# Patient Record
Sex: Female | Born: 1955 | Race: Black or African American | Hispanic: No | Marital: Single | Smoking: Current every day smoker
Health system: Southern US, Community
[De-identification: ages and names within clinical notes are randomized; demographics above are authoritative.]

## PROBLEM LIST (undated history)

## (undated) DIAGNOSIS — T8859XA Other complications of anesthesia, initial encounter: Secondary | ICD-10-CM

## (undated) DIAGNOSIS — M4800 Spinal stenosis, site unspecified: Secondary | ICD-10-CM

## (undated) DIAGNOSIS — T4145XA Adverse effect of unspecified anesthetic, initial encounter: Secondary | ICD-10-CM

## (undated) DIAGNOSIS — J302 Other seasonal allergic rhinitis: Secondary | ICD-10-CM

## (undated) DIAGNOSIS — I10 Essential (primary) hypertension: Secondary | ICD-10-CM

## (undated) HISTORY — PX: CERVICAL FUSION: SHX112

## (undated) HISTORY — PX: BREAST BIOPSY: SHX20

## (undated) HISTORY — PX: BACK SURGERY: SHX140

## (undated) HISTORY — PX: OTHER SURGICAL HISTORY: SHX169

---

## 1998-10-03 ENCOUNTER — Ambulatory Visit (HOSPITAL_COMMUNITY): Admission: RE | Admit: 1998-10-03 | Discharge: 1998-10-03 | Payer: Self-pay | Admitting: Internal Medicine

## 1998-10-03 ENCOUNTER — Encounter: Payer: Self-pay | Admitting: Internal Medicine

## 1998-10-05 ENCOUNTER — Encounter: Payer: Self-pay | Admitting: Internal Medicine

## 1998-11-15 ENCOUNTER — Ambulatory Visit (HOSPITAL_BASED_OUTPATIENT_CLINIC_OR_DEPARTMENT_OTHER): Admission: RE | Admit: 1998-11-15 | Discharge: 1998-11-15 | Payer: Self-pay | Admitting: Orthopedic Surgery

## 1999-09-26 ENCOUNTER — Ambulatory Visit (HOSPITAL_COMMUNITY): Admission: RE | Admit: 1999-09-26 | Discharge: 1999-09-26 | Payer: Self-pay | Admitting: Internal Medicine

## 1999-09-26 ENCOUNTER — Encounter: Payer: Self-pay | Admitting: Internal Medicine

## 1999-11-30 ENCOUNTER — Ambulatory Visit (HOSPITAL_COMMUNITY): Admission: RE | Admit: 1999-11-30 | Discharge: 1999-11-30 | Payer: Self-pay | Admitting: Orthopaedic Surgery

## 1999-12-14 ENCOUNTER — Ambulatory Visit (HOSPITAL_COMMUNITY): Admission: RE | Admit: 1999-12-14 | Discharge: 1999-12-14 | Payer: Self-pay | Admitting: Orthopaedic Surgery

## 1999-12-28 ENCOUNTER — Ambulatory Visit (HOSPITAL_COMMUNITY): Admission: RE | Admit: 1999-12-28 | Discharge: 1999-12-28 | Payer: Self-pay | Admitting: Orthopaedic Surgery

## 2000-02-22 ENCOUNTER — Ambulatory Visit (HOSPITAL_COMMUNITY): Admission: RE | Admit: 2000-02-22 | Discharge: 2000-02-22 | Payer: Self-pay | Admitting: Neurosurgery

## 2000-02-22 ENCOUNTER — Encounter: Payer: Self-pay | Admitting: Neurosurgery

## 2000-03-11 ENCOUNTER — Ambulatory Visit (HOSPITAL_COMMUNITY): Admission: RE | Admit: 2000-03-11 | Discharge: 2000-03-12 | Payer: Self-pay | Admitting: Neurosurgery

## 2000-03-11 ENCOUNTER — Encounter: Payer: Self-pay | Admitting: Neurosurgery

## 2000-04-05 ENCOUNTER — Ambulatory Visit (HOSPITAL_COMMUNITY): Admission: RE | Admit: 2000-04-05 | Discharge: 2000-04-05 | Payer: Self-pay | Admitting: Neurosurgery

## 2000-04-05 ENCOUNTER — Encounter: Payer: Self-pay | Admitting: Neurosurgery

## 2000-04-23 ENCOUNTER — Encounter: Payer: Self-pay | Admitting: Neurosurgery

## 2000-04-23 ENCOUNTER — Ambulatory Visit (HOSPITAL_COMMUNITY): Admission: RE | Admit: 2000-04-23 | Discharge: 2000-04-23 | Payer: Self-pay | Admitting: Neurosurgery

## 2000-05-08 ENCOUNTER — Ambulatory Visit (HOSPITAL_COMMUNITY): Admission: RE | Admit: 2000-05-08 | Discharge: 2000-05-08 | Payer: Self-pay | Admitting: Neurosurgery

## 2000-05-08 ENCOUNTER — Encounter: Payer: Self-pay | Admitting: Neurosurgery

## 2000-12-03 ENCOUNTER — Ambulatory Visit (HOSPITAL_BASED_OUTPATIENT_CLINIC_OR_DEPARTMENT_OTHER): Admission: RE | Admit: 2000-12-03 | Discharge: 2000-12-03 | Payer: Self-pay | Admitting: Pulmonary Disease

## 2001-04-28 ENCOUNTER — Encounter: Payer: Self-pay | Admitting: Neurosurgery

## 2001-04-28 ENCOUNTER — Encounter: Admission: RE | Admit: 2001-04-28 | Discharge: 2001-04-28 | Payer: Self-pay | Admitting: Neurosurgery

## 2003-11-18 ENCOUNTER — Emergency Department (HOSPITAL_COMMUNITY): Admission: EM | Admit: 2003-11-18 | Discharge: 2003-11-18 | Payer: Self-pay | Admitting: Emergency Medicine

## 2004-05-22 ENCOUNTER — Emergency Department (HOSPITAL_COMMUNITY): Admission: EM | Admit: 2004-05-22 | Discharge: 2004-05-22 | Payer: Self-pay | Admitting: Emergency Medicine

## 2004-06-08 ENCOUNTER — Other Ambulatory Visit (HOSPITAL_COMMUNITY): Admission: RE | Admit: 2004-06-08 | Discharge: 2004-09-06 | Payer: Self-pay | Admitting: Psychiatry

## 2004-06-29 ENCOUNTER — Ambulatory Visit (HOSPITAL_COMMUNITY): Payer: Self-pay | Admitting: Professional Counselor

## 2005-01-09 ENCOUNTER — Emergency Department (HOSPITAL_COMMUNITY): Admission: EM | Admit: 2005-01-09 | Discharge: 2005-01-09 | Payer: Self-pay | Admitting: Emergency Medicine

## 2005-01-12 ENCOUNTER — Ambulatory Visit: Payer: Self-pay | Admitting: Internal Medicine

## 2005-02-14 ENCOUNTER — Ambulatory Visit: Payer: Self-pay | Admitting: Internal Medicine

## 2005-05-21 ENCOUNTER — Ambulatory Visit: Payer: Self-pay | Admitting: Internal Medicine

## 2005-11-29 ENCOUNTER — Encounter (INDEPENDENT_AMBULATORY_CARE_PROVIDER_SITE_OTHER): Payer: Self-pay | Admitting: Family Medicine

## 2005-12-15 ENCOUNTER — Ambulatory Visit: Payer: Self-pay | Admitting: Family Medicine

## 2006-06-13 ENCOUNTER — Emergency Department (HOSPITAL_COMMUNITY): Admission: EM | Admit: 2006-06-13 | Discharge: 2006-06-13 | Payer: Self-pay | Admitting: Emergency Medicine

## 2006-07-09 ENCOUNTER — Ambulatory Visit: Payer: Self-pay | Admitting: Internal Medicine

## 2006-07-10 ENCOUNTER — Ambulatory Visit: Payer: Self-pay | Admitting: *Deleted

## 2006-11-05 ENCOUNTER — Ambulatory Visit: Payer: Self-pay | Admitting: Internal Medicine

## 2006-11-11 ENCOUNTER — Ambulatory Visit: Payer: Self-pay | Admitting: Internal Medicine

## 2007-02-05 ENCOUNTER — Ambulatory Visit: Payer: Self-pay | Admitting: Family Medicine

## 2007-03-08 ENCOUNTER — Emergency Department (HOSPITAL_COMMUNITY): Admission: EM | Admit: 2007-03-08 | Discharge: 2007-03-08 | Payer: Self-pay | Admitting: Emergency Medicine

## 2007-03-10 ENCOUNTER — Ambulatory Visit: Payer: Self-pay | Admitting: Internal Medicine

## 2007-07-16 ENCOUNTER — Encounter (INDEPENDENT_AMBULATORY_CARE_PROVIDER_SITE_OTHER): Payer: Self-pay | Admitting: *Deleted

## 2007-07-28 ENCOUNTER — Encounter (INDEPENDENT_AMBULATORY_CARE_PROVIDER_SITE_OTHER): Payer: Self-pay | Admitting: Family Medicine

## 2007-07-28 DIAGNOSIS — E785 Hyperlipidemia, unspecified: Secondary | ICD-10-CM

## 2007-07-28 DIAGNOSIS — I10 Essential (primary) hypertension: Secondary | ICD-10-CM | POA: Insufficient documentation

## 2007-08-18 DIAGNOSIS — F17200 Nicotine dependence, unspecified, uncomplicated: Secondary | ICD-10-CM | POA: Insufficient documentation

## 2008-04-04 ENCOUNTER — Inpatient Hospital Stay (HOSPITAL_COMMUNITY): Admission: EM | Admit: 2008-04-04 | Discharge: 2008-04-05 | Payer: Self-pay | Admitting: Emergency Medicine

## 2008-04-08 ENCOUNTER — Encounter (INDEPENDENT_AMBULATORY_CARE_PROVIDER_SITE_OTHER): Payer: Self-pay | Admitting: Internal Medicine

## 2008-04-08 ENCOUNTER — Observation Stay (HOSPITAL_COMMUNITY): Admission: EM | Admit: 2008-04-08 | Discharge: 2008-04-09 | Payer: Self-pay | Admitting: Emergency Medicine

## 2008-10-04 ENCOUNTER — Emergency Department (HOSPITAL_COMMUNITY): Admission: EM | Admit: 2008-10-04 | Discharge: 2008-10-04 | Payer: Self-pay | Admitting: Emergency Medicine

## 2009-01-20 ENCOUNTER — Encounter: Admission: RE | Admit: 2009-01-20 | Discharge: 2009-01-20 | Payer: Self-pay | Admitting: Emergency Medicine

## 2009-05-05 IMAGING — CT CT HEAD W/O CM
1 of 2 series · 13 of 30 positions shown, 17 images · non-contrast
Comparison: None

CLINICAL DATA: Dizziness.  Left arm parasthesias.

CT HEAD WITHOUT CONTRAST
TECHNIQUE: Contiguous axial images were obtained from the base of
the skull through the vertex without contrast

[Series 2: brain · axial · 0.47mm/px · z∈[+105,+223]mm · 13 of 32 slices shown, 17 images]
[im 3/32  brain]
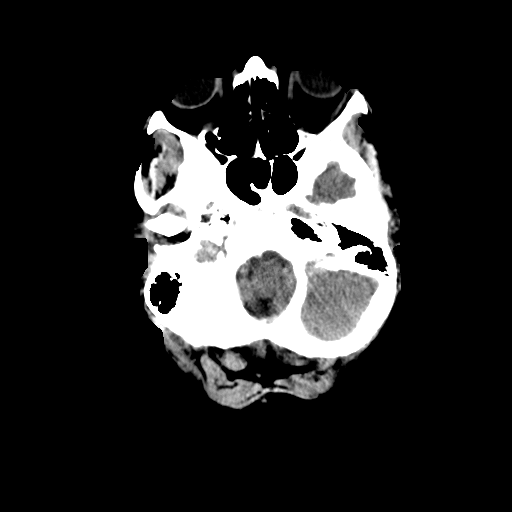
[im 3/32  bone]
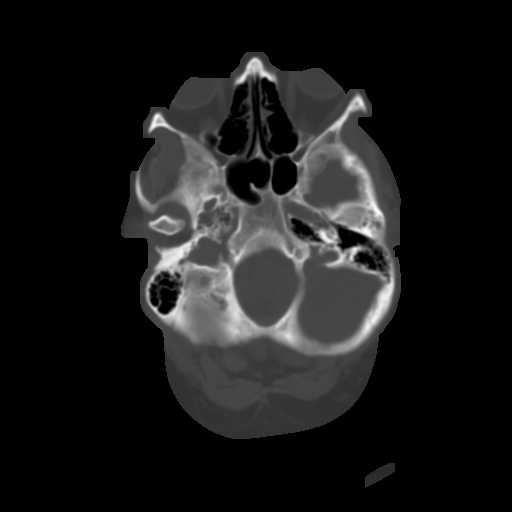
[im 5/32  brain]
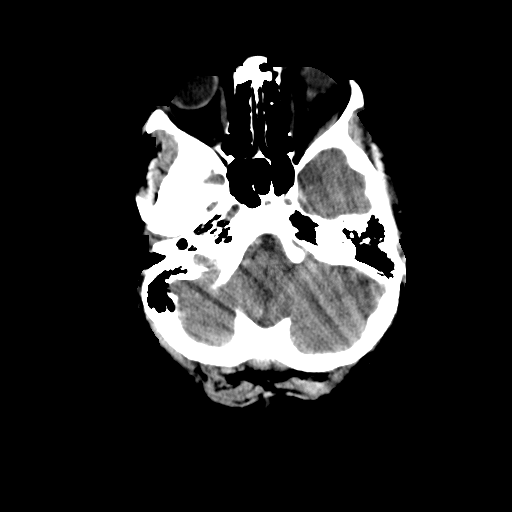
[im 7/32  brain]
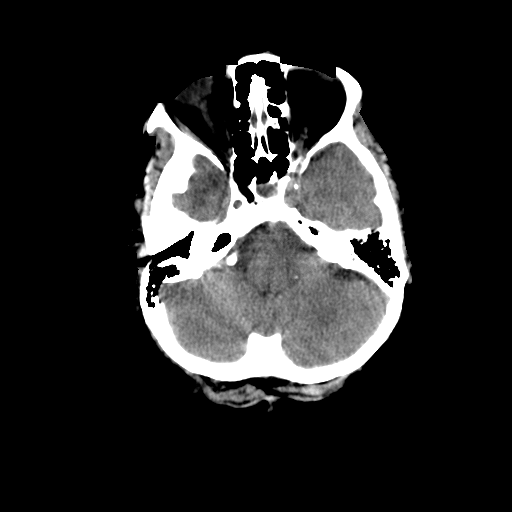
[im 9/32  brain]
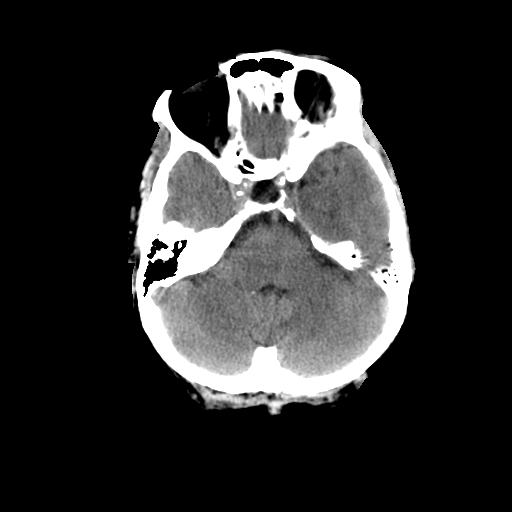
[im 12/32  brain]
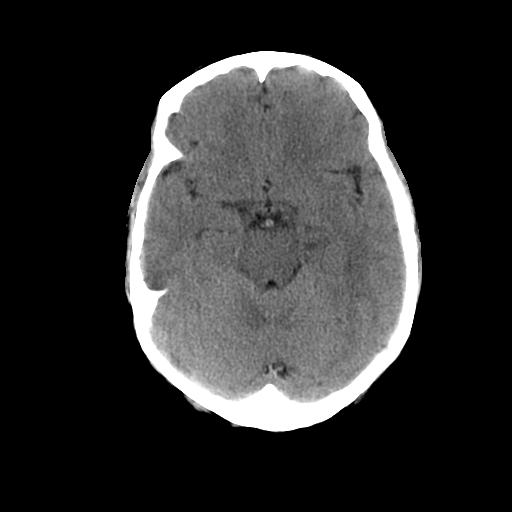
[im 12/32  bone]
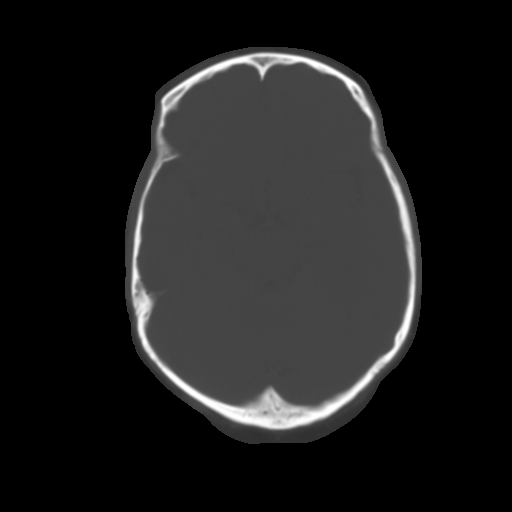
[im 14/32  brain]
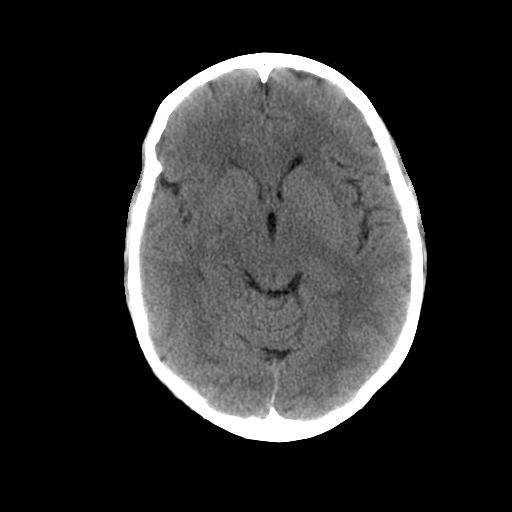
[im 16/32  brain]
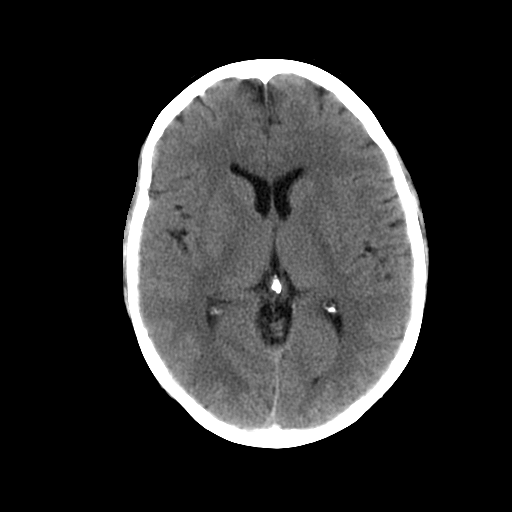
[im 18/32  brain]
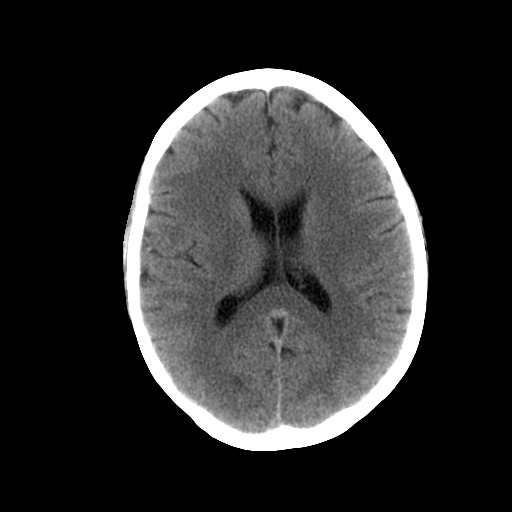
[im 20/32  brain]
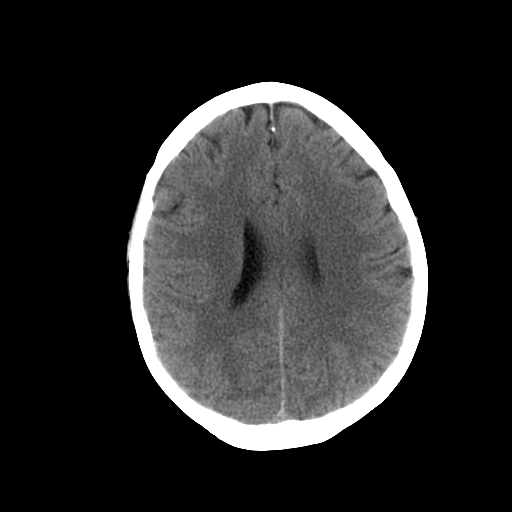
[im 20/32  bone]
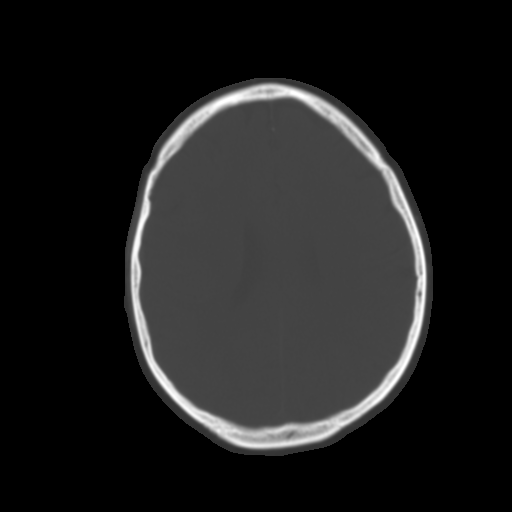
[im 23/32  brain]
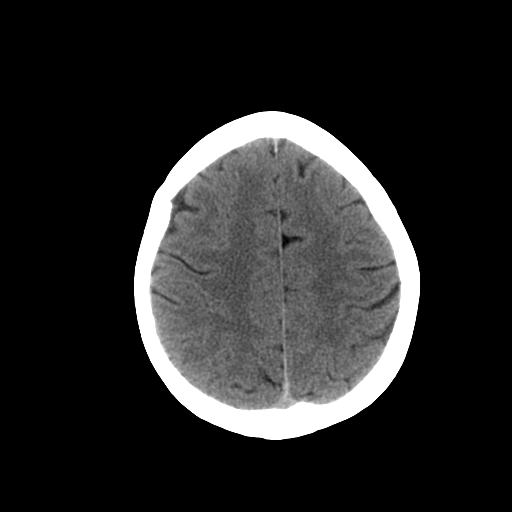
[im 25/32  brain]
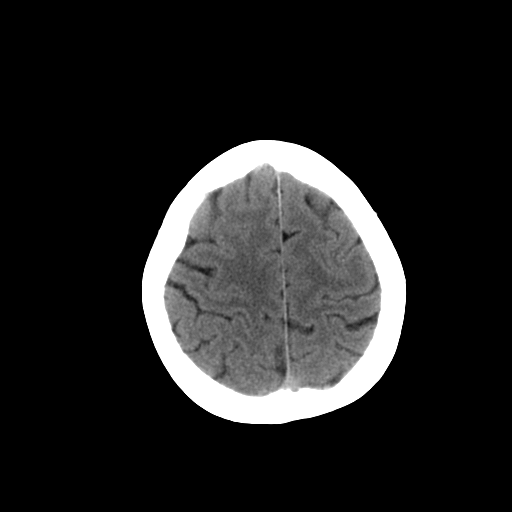
[im 27/32  brain]
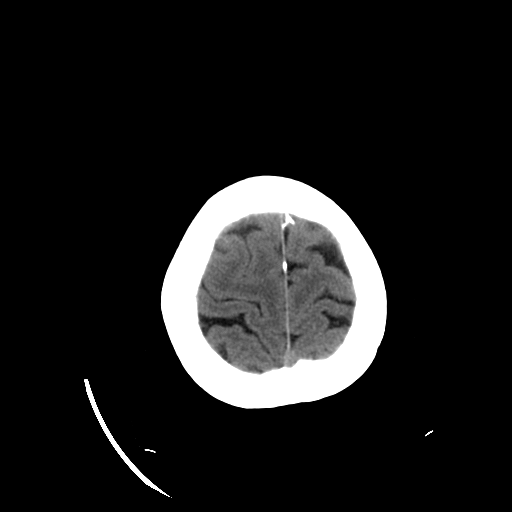
[im 29/32  brain]
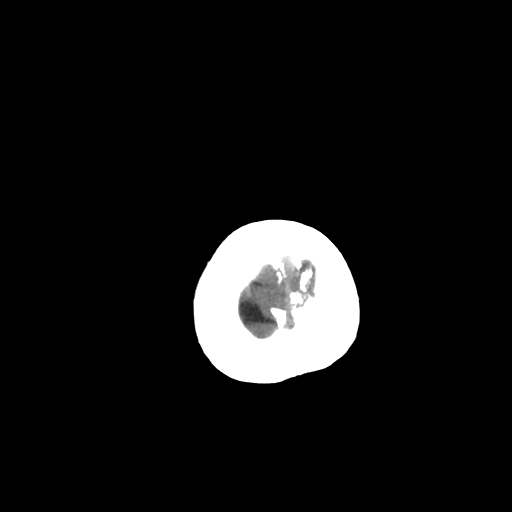
[im 29/32  bone]
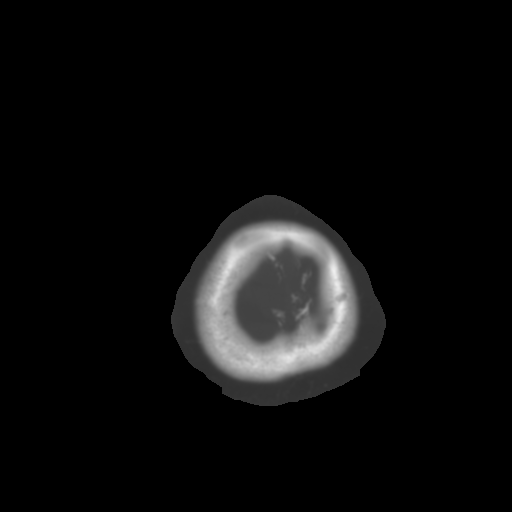

[13 of 30 positions shown; findings below may reference images not displayed]

FINDINGS: There is no evidence of intracranial hemorrhage, brain
edema, or other signs of acute infarction.  There is no evidence of
intracranial mass lesions, or mass effect.  No abnormal extraaxial
fluid collections are identified.  There is no evidence of
hydrocephalus, or other significant intracranial abnormality.  No
skull abnormality identified.
IMPRESSION: Negative non-contrast head CT.

## 2009-10-29 HISTORY — PX: COLONOSCOPY: SHX174

## 2010-11-19 ENCOUNTER — Encounter: Payer: Self-pay | Admitting: Emergency Medicine

## 2011-03-13 NOTE — H&P (Signed)
NAME:  Tanya Patton, Tanya Patton NO.:  [id number]   MEDICAL RECORD NO.:  [id number]          PATIENT TYPE:  EMS   LOCATION:  MAJO                         FACILITY:  [facility]   PHYSICIAN:  Michelene Gardener, MD    DATE OF BIRTH:  1956-06-01   DATE OF ADMISSION:  04/08/2008  DATE OF DISCHARGE:                              HISTORY & PHYSICAL   PRIMARY CARE PHYSICIAN:  Unassigned.   HISTORY OF PRESENT ILLNESS:  This is 55 year old African-American female  with past medical history of hypertension, obesity, and hyperlipidemia  presented with chest pain.  This patient was admitted to the hospital in  the period between June 7 to April 05, 2008.  She was observed overnight,  ruled out for MI, and she was sent home.  Her medications were switched  at that time from hydrochlorothiazide to lisinopril because of  hypokalemia.  The patient followed with her primary doctor last Tuesday.  She has been doing fine.  Yesterday she was complaining of nausea and  dry heaves.  Today she was complaining of chest pain described as  heaviness in the middle of her chest with some radiation to her left  shoulder and associated with some numbness in her left fingers.  She was  also having some nausea, some dry heaves.  She was also complaining of  numbness around her mouth.  Denied sweating.  She was positive for  shortness of breath.  There are no palpitations, and there is no  syncope.  She came to the ER.  CT scan of the head was done, and it  seems to be normal.   PAST MEDICAL HISTORY:  Significant for:   1. Hypertension.  2. Obesity.  3. Borderline hyperlipidemia.   PAST SURGICAL HISTORY:  1. Back surgery.  2. Elbow and wrist surgery.   CURRENT MEDICATIONS:  Lisinopril 10 mg once a day.   ALLERGIES:  No known drug allergies.   SOCIAL HISTORY:  She lives with her sister.  She has one daughter.  She  smoked one pack per day for more than 33 years.  She used to drink  occasionally for 30  years.  No history of drug use.   FAMILY HISTORY:  Her grandfather died of heart attack in his 14's.   REVIEW OF SYSTEMS:  CONSTITUTIONAL:  There is no fatigability.  No  fever.  EYES:  No blurred vision.  ENT:  No tinnitus, no epistaxis, no  difficulty swallowing.  RESPIRATORY:  No cough, no wheezes, no  hemoptysis.  CARDIOVASCULAR:  Positive for chest pain and shortness of  breath.  There is no syncope.  There is no palpitations.  GU:  No  dysuria, no hematuria.  ENDOCRINE:  No polyuria, no nocturia.  HEMATOLOGY:  No bruises, no bleeding.  ID:  No rashes, no lesions.  NEUROLOGICAL:  Positive for numbness in her left upper extremity.  SKIN:  No rash, no lesions.  The rest of Review of Systems were reviewed, and  they were negative.   PHYSICAL EXAMINATION:  VITAL SIGNS:  Temperature is 150/91, pulse 89,  respiratory rate 20, temperature  97.6.  GENERAL APPEARANCE:  This is an obese middle-aged African-American  female not in acute distress.  HEENT:  Her conjunctivae are pink.  Her pupils are equal and reactive to  light.  There is no ptosis.  Hearing is intact.  There is no ear  discharge or infection.  There is no nose infection or bleeding.  Oral  mucosa is dry.  No pharyngeal erythema.  NECK:  Supple.  No JVD, no carotid bruit, no thyroid enlargement.  CARDIOVASCULAR:  S1, S2 are regular.  There are no murmurs.  RESPIRATORY:  The patient is breathing between 16-18.  There are no  rales, no rhonchi, no wheezes.  ABDOMEN:  Soft, nondistended.  No hepatosplenomegaly.  Bowel sounds are  normal.  EXTREMITIES:  Lower extremities:  No edema, no rash, and no varicose  veins.  SKIN:  No rash, no erythema.  NEUROLOGICAL:  Cranial nerves are intact.  There are no motor or sensory  deficits.   LABORATORY RESULTS:  WBC 5.7, hemoglobin 14.1, hematocrit 40.8, platelet  count 243, MCV 92.3.  CK-MB is less than 1, troponin less than 0.05, INR  0.9.  Urinalysis negative.  Sodium 141, potassium  4.4, chloride 110,  bicarb 22, glucose 100, BUN 10, creatinine 0.62.   Chest x-ray showed no acute abnormalities.  CT scan of the head  negative.   IMPRESSION:  1. Chest pain.  2. Hypertension.  3. Obesity  4. Hyperlipidemia.  5. Tobacco abuse.  6. Anxiety.   PLAN:  This patient is a 55 year old female who had risk factors for  coronary artery disease that includes hypertension, obesity, smoking,  and borderline hyperlipidemia.  The patient was admitted a few days ago  for evaluation of chest pain.  She was ruled out and was sent home.  Came in again with another episode of chest pain.  I will admit her to  telemetry.  Will get three sets of troponin and cardiac enzymes.  Will  get echocardiogram to assess her wall motion.  Since her second  admission in a few days I will get cardiac evaluation for possible  stress test.  I will put her on aspirin.  Will continue her lisinopril.  Will add metoprolol.  I will also put her on Lovenox subcu.   Assessment time is one hour.      Michelene Gardener, MD  Electronically Signed     NAE/MEDQ  D:  04/08/2008  T:  04/08/2008  Job:  [id number]

## 2011-03-13 NOTE — H&P (Signed)
NAME:  Tanya Patton, Tanya Patton NO.:  [id number]   MEDICAL RECORD NO.:  [id number]          PATIENT TYPE:  EMS   LOCATION:  MAJO                         FACILITY:  [facility]   PHYSICIAN:  Herbie Saxon, MDDATE OF BIRTH:  25-Aug-1956   DATE OF ADMISSION:  04/04/2008  DATE OF DISCHARGE:                              HISTORY & PHYSICAL   PRIMARY CARE PHYSICIAN:  Unassigned.   The patient is full code.  She has no assigned health care power of  attorney.   PRESENTING COMPLAINT:  Chest pain, mild shortness of breath, 1 hour.   HISTORY OF PRESENTING COMPLAINT:  This is a 55 year old African-American  female with past medical history of hypertension who has got well until  1 hour prior to presentation when she started experiencing sharp  retrosternal chest pain radiating to her left arm.  Chest pain was  10/10, increased with exertional movement, relieved by lying still,  associated with dry cough.  No hemoptysis.  No palpitations.  No  diaphoresis.  No loss of consciousness.  She denies any pedal swelling  or facial swelling.  She had mild anxiety and shortness of breath when  the chest pain started.  The chest pain has been relieved after she was  started on nitroglycerine and aspirin, this is 4/10 intensity at  present.  She denies previous episodes of chest pain.  She does have a  family history of heart attack on maternal grandfather.  She has more  than 33-year history of tobacco smoking.  The patient denies any  symptoms referable to the genitourinary, gastrointestinal, respiratory,  neurological systems.  No joint swelling.  No skin rash.  No fever.   PAST MEDICAL HISTORY:  Hypertension.   PAST SURGICAL HISTORY:  1. Back surgery.  2. Elbow and wrist surgery.   SOCIAL HISTORY:  She lives with her sister.  She has 1 daughter.  She  smokes 1 pack a day for more than 33 years.  She used to drink socially  for about 30 years.  No history of drug abuse.   REVIEW OF  SYSTEMS:  Fourteen systems are reviewed, pertinent positives  as in the history of presenting complaints.   MEDICATIONS:  HCTZ 1 tablet daily.   ALLERGIES:  No known drug allergies.   PHYSICAL EXAMINATION:  GENERAL:  On examination, she is a middle-aged  lady, obese, not in acute respiratory distress.  VITAL SIGNS:  Temperature is 97.4, pulse is 80, respiratory rate is 16,  and blood pressure 104/64.  HEENT:  Pupils are equal, reactive to light and accommodation.  She has  no pallor or jaundice.  There is no cyanosis or finger clubbing.  Extraocular muscles are intact.  Head is atraumatic and normocephalic.  Mucous membranes are moist.  NECK:  Supple.  No elevated JVD or thyromegaly.  CHEST:  Clinically clear.  HEART:  Heart sounds 1 and 2, regular rate and rhythm.  ABDOMEN:  She has truncal obesity, soft, and nontender.  No  organomegaly.  NEUROLOGIC:  She is alert and oriented in time, place, and person.  Power is 5 globally.  Deep  tendon reflexes 2 globally.  Cranial nerves  II-XII are intact.  Sensory system normal.  Peripheral pulses present.  No pedal edema.   LABORATORY DATA:  WBC of 5.8, hematocrit 39.8, platelet count 239.  Troponin I less than 0.04.  Chemistry shows a sodium of 129, potassium  3.4, chloride 102, bicarbonate 28, glucose 108, BUN 18, creatinine 0.6,  INR of 0.9, PT 11.8, PTT 34.  Chest x-ray shows mild basilar  atelectasis.  No significant infiltrate or effusion.  EKG, normal sinus  rhythm at 86 per minute.   ASSESSMENT:  1. Atypical chest pain, unclear acute coronary syndrome.  2. Hypokalemia on diuretic.  3. Tobacco abuse.  4. Anxiety.  5. Morbid obesity.  6. Hyperglycemia.   The patient is to be admitted to an observation telemetry bed, get a  serial cardiac enzymes and EEG q.8 h. x3.  Obtain a 2D echocardiogram in  the morning, obtain a D-dimer, hemoccult, thyroid function test, fasting  lipids, hemoglobin A1c of 16 level, and urinalysis.  She  was given  morphine 2 mg IV q.6 h. p.r.n. for chest pain, oxygen 2-4 liters nasal  cannula to keep saturations greater than 90, Nitro paste q.6 h.  Hold  nitro paste if blood pressure is less than 100/60.  Started on Toprol-XL  12.5 mg daily.  Continue HCTZ 12.5 mg daily supplement, potassium 20 mEq  IV and potassium 20 mEq p.o. daily.  Check CBC and CMP in the morning,  counsel on tobacco cessation.  Started on nicotine patch 21 mg daily,  Xanax 0.5 mg p.o. b.i.d., Protonix 40 mg IV daily, Lovenox 40 mg subcu  daily, Phenergan 25 mg IV q.8 h. p.r.n. for nausea.   DIET:  Heart healthy, IV fluid normal saline at 30 mL an hour.  Diet  also will be low cholesterol.  The patient's illness, medications,  treatments plan were explained to her.  She verbalizes understanding.      Herbie Saxon, MD  Electronically Signed     MIO/MEDQ  D:  04/04/2008  T:  04/05/2008  Job:  [id number]

## 2011-03-16 NOTE — Op Note (Signed)
[facility]  Patient:    Tanya Patton, Tanya Patton                    MRN: [id number] Proc. Date: 03/11/00 Adm. Date:  [id number] Attending:  Gerald Dexter                           Operative Report  PREOPERATIVE DIAGNOSIS:  Herniated disc, L4-5, left.  POSTOPERATIVE DIAGNOSIS:  Herniated disc, L4-5, left.  PROCEDURE: 1. Left L4-5 intralaminal laminotomy for excision of herniated disc with    operative microscope. 2. Microdissection L4-5 disc and all five nerve roots.  SURGEON:  Reinaldo Meeker, M.D.  ASSISTANT:  Julio Sicks, M.D.  PROCEDURE IN DETAIL:  After being placed in the prone position, the patients back was prepped and draped in the usual sterile fashion.  Localizing x-ray was taken to prior to incision to identify the L4-5 level.  Midline incision made above the spinous processes of L4 and L5.  Using the Bovie cutting current, the incision was carried down to the spinous processes. Subperiosteal dissection was then carried out along the left side of the spinous processes and the lamina and the McCall self-retaining retractor was placed for exposure.  Second x-ray was taken to confirm approach to the L4-5 and this was correct.  Using the high speed drill the inferior one-third of the L4 lamina and the medial one-third of the facet joint were removed.  It was then used to remove the superior one-third of the L5 lamina.  Residual bone and ligamentum flava were removed in a piecemeal fashion.  The microscope was draped and brought into the field and used for the remainder of the case. Using microdissection technique, the lateral aspect of the thecal sac and all five nerve roots were identified.  Further coagulation was carried out down the canal to identify the L4-5 disc which was found to be herniated toward the foramen.  After coagulating on the annulus, the annulus was excised with a 15 blade.  Using pituitary rongeurs and curets, the  disc space was thoroughly cleaned out including particular attention toward the foramen where a large number of fragments of disc material and subligamentous tissue were removed. This gave excellent decompression within the foramen.  At this point inspection was carried out in all direction for any evidence of residual compression and none could be identified.  Large amounts of irrigation were carried and any bleeding controlled with coagulation and Gelfoam.  The wound was then closed using interrupted Vicryl in the muscle, fascia, subcutaneous and subcuticular tissue and staples on the skin.  Sterile was then applied. The patient was extubated and taken to the recovery room in stable condition. D:  03/11/00 TD:  03/12/00 Job: [id number]

## 2011-03-16 NOTE — Discharge Summary (Signed)
NAME:  CAROLYNNE, SCHUCHARD             ACCOUNT NO.:  [id number]   MEDICAL RECORD NO.:  [id number]          PATIENT TYPE:  INP   LOCATION:  4740                         FACILITY:  [facility]   PHYSICIAN:  Michelene Gardener, MD    DATE OF BIRTH:  [birth year]   DATE OF ADMISSION:  04/08/2008  DATE OF DISCHARGE:  04/09/2008                               DISCHARGE SUMMARY   PRIMARY PHYSICIAN:  Unassigned   DISCHARGE DIAGNOSES:  1. Chest pain.  2. Hypertension.  3. Obesity.  4. Hyperlipidemia.  5. Tobacco abuse.  6. Anxiety.   DISCHARGE MEDICATIONS:  1. Lisinopril 10 mg once a day.  2. Aspirin 325 mg p.o. once a day.   CONSULTATIONS:  Cardiology consult.   PROCEDURES:  None.   RADIOLOGY STUDIES:  1. Chest x-ray.  On April 08, 2008, showed no acute abnormalities.  2. A CT scan of the head without contrast.  On April 08, 2008, showed      no acute abnormalities.   COURSE OF HOSPITALIZATION:  This is a 55 year old female with past medical  history of hypertension who presented to the hospital complaining of  chest pain.  The patient was admitted to telemetry.  No arrhythmias were  seen in his telemetry.  Three sets of troponin and cardiac enzymes were  done and they came to be normal.  His EKG showed no evidence of acute  ischemia.  Cardiology evaluation was done, and the patient was  recommended for outpatient stress test.  Stress test was scheduled at  12:00 p.m. on Tuesday following his discharge.  At the time of  discharge, the patient was stable.  Chest pain resolved.  Vitals were  stable.   TIME SPENT:  40 minutes.      Michelene Gardener, MD  Electronically Signed     NAE/MEDQ  D:  05/08/2008  T:  05/09/2008  Job:  [id number]

## 2011-07-26 LAB — CBC
HCT: 40.8
Hemoglobin: 14.1
MCHC: 34.4
MCHC: 34.5
MCV: 92.3
Platelets: 243
RBC: 4.3
RBC: 4.42
RDW: 13.8
RDW: 13.9
WBC: 5.7

## 2011-07-26 LAB — URINALYSIS, ROUTINE W REFLEX MICROSCOPIC
Bilirubin Urine: NEGATIVE
Glucose, UA: NEGATIVE
Ketones, ur: NEGATIVE
Ketones, ur: NEGATIVE
Leukocytes, UA: NEGATIVE
Nitrite: NEGATIVE
Protein, ur: NEGATIVE
Protein, ur: NEGATIVE
Specific Gravity, Urine: 1.022
Urobilinogen, UA: 1
Urobilinogen, UA: 1
pH: 6

## 2011-07-26 LAB — HOMOCYSTEINE: Homocysteine: 14.3

## 2011-07-26 LAB — POCT CARDIAC MARKERS
CKMB, poc: <1 — ABNORMAL LOW
CKMB, poc: <1 — ABNORMAL LOW
Myoglobin, poc: 40.7
Myoglobin, poc: 48.5
Operator id: 294501
Troponin i, poc: <0.05

## 2011-07-26 LAB — COMPREHENSIVE METABOLIC PANEL WITH GFR
ALT: 30
AST: 22
Albumin: 3.4 — ABNORMAL LOW
Alkaline Phosphatase: 57
BUN: 10
CO2: 22
Calcium: 8.9
Chloride: 110
Creatinine, Ser: 0.62
GFR calc non Af Amer: >60
Glucose, Bld: 100 — ABNORMAL HIGH
Potassium: 4.1
Sodium: 141
Total Bilirubin: 0.9
Total Protein: 6.4

## 2011-07-26 LAB — CARDIAC PANEL(CRET KIN+CKTOT+MB+TROPI)
CK, MB: 0.7
CK, MB: 0.7
CK, MB: 0.7
Relative Index: INVALID
Relative Index: INVALID
Relative Index: INVALID
Total CK: 102
Total CK: 71
Total CK: 79

## 2011-07-26 LAB — BASIC METABOLIC PANEL WITH GFR
BUN: 18
CO2: 28
Calcium: 9.4
Chloride: 102
Creatinine, Ser: 0.68
GFR calc non Af Amer: >60
Glucose, Bld: 108 — ABNORMAL HIGH
Potassium: 3.4 — ABNORMAL LOW
Sodium: 139

## 2011-07-26 LAB — D-DIMER, QUANTITATIVE: D-Dimer, Quant: <0.22

## 2011-07-26 LAB — COMPREHENSIVE METABOLIC PANEL
CO2: 27
Calcium: 9.1
Creatinine, Ser: 0.65
GFR calc non Af Amer: >60
Glucose, Bld: 89

## 2011-07-26 LAB — URINE MICROSCOPIC-ADD ON

## 2011-07-26 LAB — HEMOGLOBIN A1C
Hgb A1c MFr Bld: 5.8
Mean Plasma Glucose: 129

## 2011-07-26 LAB — CK TOTAL AND CKMB (NOT AT ARMC)
CK, MB: 0.9
Relative Index: 0.8

## 2011-07-26 LAB — DIFFERENTIAL
Basophils Absolute: 0
Lymphocytes Relative: 48 — ABNORMAL HIGH
Lymphs Abs: 2.8
Neutro Abs: 2.4
Neutrophils Relative %: 41 — ABNORMAL LOW

## 2011-07-26 LAB — PROTIME-INR
INR: 0.9
INR: 0.9
Prothrombin Time: 11.8
Prothrombin Time: 12.6

## 2011-07-26 LAB — LIPID PANEL: VLDL: 36

## 2011-07-26 LAB — TROPONIN I: Troponin I: <0.01

## 2011-07-26 LAB — APTT: aPTT: 36

## 2011-08-03 LAB — DIFFERENTIAL
Eosinophils Absolute: 0.1 10*3/uL (ref 0.0–0.7)
Eosinophils Relative: 1 % (ref 0–5)
Lymphocytes Relative: 37 % (ref 12–46)
Lymphs Abs: 3.2 10*3/uL (ref 0.7–4.0)
Monocytes Absolute: 0.3 10*3/uL (ref 0.1–1.0)

## 2011-08-03 LAB — RAPID URINE DRUG SCREEN, HOSP PERFORMED
Amphetamines: NOT DETECTED
Barbiturates: NOT DETECTED
Cocaine: NOT DETECTED
Opiates: NOT DETECTED
Tetrahydrocannabinol: NOT DETECTED

## 2011-08-03 LAB — POCT I-STAT, CHEM 8
BUN: 16 mg/dL (ref 6–23)
Calcium, Ion: 1.09 mmol/L — ABNORMAL LOW (ref 1.12–1.32)
Creatinine, Ser: 0.9 mg/dL (ref 0.4–1.2)
Glucose, Bld: 119 mg/dL — ABNORMAL HIGH (ref 70–99)
Hemoglobin: 16.3 g/dL — ABNORMAL HIGH (ref 12.0–15.0)
TCO2: 21 mmol/L (ref 0–100)

## 2011-08-03 LAB — CBC
HCT: 46.6 % — ABNORMAL HIGH (ref 36.0–46.0)
Hemoglobin: 15.8 g/dL — ABNORMAL HIGH (ref 12.0–15.0)
MCV: 93 fL (ref 78.0–100.0)
WBC: 8.7 10*3/uL (ref 4.0–10.5)

## 2011-08-03 LAB — ETHANOL: Alcohol, Ethyl (B): 144 mg/dL — ABNORMAL HIGH (ref 0–10)

## 2012-08-18 ENCOUNTER — Other Ambulatory Visit: Payer: Self-pay | Admitting: *Deleted

## 2012-08-18 DIAGNOSIS — Z1231 Encounter for screening mammogram for malignant neoplasm of breast: Secondary | ICD-10-CM

## 2012-09-18 ENCOUNTER — Ambulatory Visit
Admission: RE | Admit: 2012-09-18 | Discharge: 2012-09-18 | Disposition: A | Discharge status: Home / Self Care | Payer: BC Managed Care – PPO | Source: Ambulatory Visit | Attending: *Deleted | Admitting: *Deleted

## 2012-09-18 DIAGNOSIS — Z1231 Encounter for screening mammogram for malignant neoplasm of breast: Secondary | ICD-10-CM

## 2013-03-10 ENCOUNTER — Other Ambulatory Visit: Payer: Self-pay | Admitting: Neurosurgery

## 2013-03-10 DIAGNOSIS — M542 Cervicalgia: Secondary | ICD-10-CM

## 2013-03-10 DIAGNOSIS — M5412 Radiculopathy, cervical region: Secondary | ICD-10-CM

## 2013-03-12 ENCOUNTER — Ambulatory Visit
Admission: RE | Admit: 2013-03-12 | Discharge: 2013-03-12 | Disposition: A | Discharge status: Home / Self Care | Payer: BC Managed Care – PPO | Source: Ambulatory Visit | Attending: Neurosurgery | Admitting: Neurosurgery

## 2013-03-12 DIAGNOSIS — M542 Cervicalgia: Secondary | ICD-10-CM

## 2013-03-12 DIAGNOSIS — M5412 Radiculopathy, cervical region: Secondary | ICD-10-CM

## 2013-05-07 ENCOUNTER — Other Ambulatory Visit: Payer: Self-pay | Admitting: Neurosurgery

## 2013-05-07 DIAGNOSIS — M549 Dorsalgia, unspecified: Secondary | ICD-10-CM

## 2013-05-10 ENCOUNTER — Ambulatory Visit
Admission: RE | Admit: 2013-05-10 | Discharge: 2013-05-10 | Disposition: A | Discharge status: Home / Self Care | Payer: BC Managed Care – PPO | Source: Ambulatory Visit | Attending: Neurosurgery | Admitting: Neurosurgery

## 2013-05-10 DIAGNOSIS — M549 Dorsalgia, unspecified: Secondary | ICD-10-CM

## 2013-08-12 ENCOUNTER — Other Ambulatory Visit: Payer: Self-pay | Admitting: Neurosurgery

## 2013-08-18 ENCOUNTER — Other Ambulatory Visit: Payer: Self-pay

## 2013-08-18 DIAGNOSIS — Z1231 Encounter for screening mammogram for malignant neoplasm of breast: Secondary | ICD-10-CM

## 2013-09-08 ENCOUNTER — Encounter (HOSPITAL_COMMUNITY): Payer: Self-pay | Admitting: Emergency Medicine

## 2013-09-08 ENCOUNTER — Emergency Department (HOSPITAL_COMMUNITY)
Admission: EM | Admit: 2013-09-08 | Discharge: 2013-09-08 | Disposition: A | Discharge status: Home / Self Care | Payer: BC Managed Care – PPO | Attending: Emergency Medicine | Admitting: Emergency Medicine

## 2013-09-08 DIAGNOSIS — E669 Obesity, unspecified: Secondary | ICD-10-CM | POA: Insufficient documentation

## 2013-09-08 DIAGNOSIS — M48061 Spinal stenosis, lumbar region without neurogenic claudication: Secondary | ICD-10-CM | POA: Insufficient documentation

## 2013-09-08 DIAGNOSIS — M4800 Spinal stenosis, site unspecified: Secondary | ICD-10-CM

## 2013-09-08 DIAGNOSIS — G8929 Other chronic pain: Secondary | ICD-10-CM | POA: Insufficient documentation

## 2013-09-08 DIAGNOSIS — F17200 Nicotine dependence, unspecified, uncomplicated: Secondary | ICD-10-CM | POA: Insufficient documentation

## 2013-09-08 DIAGNOSIS — I10 Essential (primary) hypertension: Secondary | ICD-10-CM | POA: Insufficient documentation

## 2013-09-08 HISTORY — DX: Spinal stenosis, site unspecified: M48.00

## 2013-09-08 HISTORY — DX: Essential (primary) hypertension: I10

## 2013-09-08 MED ORDER — HYDROMORPHONE HCL PF 1 MG/ML IJ SOLN
1.0000 mg | Freq: Once | INTRAMUSCULAR | Status: AC
  Administered 2013-09-08: 1 mg via INTRAMUSCULAR
  Filled 2013-09-08: qty 1

## 2013-09-08 NOTE — ED Notes (Signed)
Pt has spinal stenosis and scheduled for surgery in Dec.  Pt having lower back pain that radiates down legs and hurts so bad she cannot walk.  No incontinence of bowel or bladder

## 2013-09-08 NOTE — ED Provider Notes (Signed)
CSN: [id number]     Arrival date & time 09/08/13  0957 History   First MD Initiated Contact with Patient 09/08/13 1011     Chief Complaint  Patient presents with  . Back Pain   (Consider location/radiation/quality/duration/timing/severity/associated sxs/prior Treatment) HPI Comments: Patient is a 57 year old female with history of spinal stenosis and hypertension who presents today for worsening left-sided low back pain. This is the same pain as her chronic spinal stenosis pain. It is a sharp pain with radiation into her left leg. It is worse in certain positions. She went to work initially today, but had to leave due to his pain. She sits all day at work and could not maintain a sitting position. The pain has gotten so bad that she can barely walk. She is scheduled to have surgery on her spine on December 2. She denies any bowel or bladder incontinence, drug use, history of cancer. She has not been ill recently and does not have a fever. There were no new injuries to her back. She takes hydrocodone at home. She reports that she has been taking this more frequently than normal duty the increase in pain. The patient reports that she didn't want to come to the emergency department, but her daughter forced her to. Her daughter is concerned that the pain is bad and her mother needs to take it easier than she has been. Her daughter would like her to be out of work until the surgery.   The history is provided by the patient. No language interpreter was used.    Past Medical History  Diagnosis Date  . Spinal stenosis   . Hypertension    Past Surgical History  Procedure Laterality Date  . Back surgery     No family history on file. History  Substance Use Topics  . Smoking status: Current Every Day Smoker  . Smokeless tobacco: Not on file  . Alcohol Use: No   OB History   Grav Para Term Preterm Abortions TAB SAB Ect Mult Living                 Review of Systems  Constitutional: Negative  for fever and chills.  Respiratory: Negative for shortness of breath.   Cardiovascular: Negative for chest pain.  Gastrointestinal: Negative for nausea, vomiting and abdominal pain.  Genitourinary: Negative for difficulty urinating.  Musculoskeletal: Positive for arthralgias, back pain, gait problem and myalgias.  All other systems reviewed and are negative.    Allergies  Atorvastatin  Home Medications  No current outpatient prescriptions on file. BP 108/94  Pulse 90  Temp(Src) 98.6 F (37 C) (Oral)  Resp 20  Wt 173 lb (78.472 kg)  SpO2 98% Physical Exam  Nursing note and vitals reviewed. Constitutional: She is oriented to person, place, and time. She appears well-developed and well-nourished.  Non-toxic appearance. She does not have a sickly appearance. She does not appear ill. No distress.  obese  HENT:  Head: Normocephalic and atraumatic.  Right Ear: External ear normal.  Left Ear: External ear normal.  Nose: Nose normal.  Mouth/Throat: Oropharynx is clear and moist.  Eyes: Conjunctivae are normal.  Neck: Normal range of motion.  Cardiovascular: Normal rate, regular rhythm, normal heart sounds, intact distal pulses and normal pulses.   Pulses:      Dorsalis pedis pulses are 2+ on the right side, and 2+ on the left side.       Posterior tibial pulses are 2+ on the right side, and 2+ on  the left side.  Pulmonary/Chest: Effort normal and breath sounds normal. No stridor. No respiratory distress. She has no wheezes. She has no rales.  Abdominal: Soft. She exhibits no distension.  Musculoskeletal: Normal range of motion.       Back:  Tender to palpation over left SI joint.  Neurological: She is alert and oriented to person, place, and time. She has normal strength.  Skin: Skin is warm and dry. She is not diaphoretic. No erythema.  Psychiatric: She has a normal mood and affect. Her behavior is normal.    ED Course  Procedures (including critical care time) Labs  Review Labs Reviewed - No data to display Imaging Review No results found.  EKG Interpretation   None      10:49 AM Discussed case with Dr. Gerlene Fee who will see her in the office tomorrow. She needs to call and schedule and appointment.  MDM   1. Spinal stenosis    Patient with back pain.  No neurological deficits and normal neuro exam.  Patient can walk but states is painful.  No loss of bowel or bladder control.  No concern for cauda equina.  No fever, night sweats, weight loss, h/o cancer, IVDU.  RICE protocol and pain medicine indicated and discussed with patient. She was given a dose of dilaudid in the ED to make her more comfortable. No rx for home. She will call Dr. Trudee Grip office for an appointment tomorrow. Patient / Family / Caregiver informed of clinical course, understand medical decision-making process, and agree with plan.    Mora Bellman, PA-C 09/08/13 1055

## 2013-09-10 NOTE — ED Provider Notes (Signed)
Medical screening examination/treatment/procedure(s) were performed by non-physician practitioner and as supervising physician I was immediately available for consultation/collaboration.  EKG Interpretation   None         Candyce Churn, MD 09/10/13 [phone]

## 2013-09-14 ENCOUNTER — Encounter (HOSPITAL_COMMUNITY): Payer: Self-pay | Admitting: Pharmacy Technician

## 2013-09-17 NOTE — Pre-Procedure Instructions (Signed)
Tanya Patton  09/17/2013   Your procedure is scheduled on:  Tuesday, September 29, 2013  Report to [facility] Short Stay (use Main Entrance "A'') at 5:30 AM.  Call this number if you have problems the morning of surgery: [phone]   Remember:   Do not eat food or drink liquids after midnight.   Take these medicines the morning of surgery with A SIP OF WATER: HYDROcodone-acetaminophen (NORCO/VICODIN) 5-325 MG per tablet if needed for pain Stop taking Aspirin, vitamins and herbal medications. Do not take any NSAIDs ie: Ibuprofen, Advil, Naproxen or any medication containing Aspirin.  Do not wear jewelry, make-up or nail polish.  Do not wear lotions, powders, or perfumes. You may wear deodorant.  Do not shave 48 hours prior to surgery.   Do not bring valuables to the hospital.  [facility] is not responsible for any belongings or valuables.               Contacts, dentures or bridgework may not be worn into surgery.  Leave suitcase in the car. After surgery it may be brought to your room.  For patients admitted to the hospital, discharge time is determined by your treatment team.               Patients discharged the day of surgery will not be allowed to drive home.  Name and phone number of your driver:   Special Instructions: Shower using CHG 2 nights before surgery and the night before surgery.  If you shower the day of surgery use CHG.  Use special wash - you have one bottle of CHG for all showers.  You should use approximately 1/3 of the bottle for each shower.   Please read over the following fact sheets that you were given: Pain Booklet, Coughing and Deep Breathing, Blood Transfusion Information, MRSA Information and Surgical Site Infection Prevention

## 2013-09-18 ENCOUNTER — Encounter (HOSPITAL_COMMUNITY)
Admission: RE | Admit: 2013-09-18 | Discharge: 2013-09-18 | Disposition: A | Discharge status: Home / Self Care | Payer: BC Managed Care – PPO | Source: Ambulatory Visit | Attending: Neurosurgery | Admitting: Neurosurgery

## 2013-09-18 ENCOUNTER — Encounter (HOSPITAL_COMMUNITY)
Admission: RE | Admit: 2013-09-18 | Discharge: 2013-09-18 | Disposition: A | Discharge status: Home / Self Care | Payer: BC Managed Care – PPO | Source: Ambulatory Visit | Attending: Anesthesiology | Admitting: Anesthesiology

## 2013-09-18 ENCOUNTER — Encounter (HOSPITAL_COMMUNITY): Payer: Self-pay

## 2013-09-18 DIAGNOSIS — Z01810 Encounter for preprocedural cardiovascular examination: Secondary | ICD-10-CM | POA: Insufficient documentation

## 2013-09-18 DIAGNOSIS — Z01812 Encounter for preprocedural laboratory examination: Secondary | ICD-10-CM | POA: Insufficient documentation

## 2013-09-18 DIAGNOSIS — Z01818 Encounter for other preprocedural examination: Secondary | ICD-10-CM | POA: Insufficient documentation

## 2013-09-18 HISTORY — DX: Adverse effect of unspecified anesthetic, initial encounter: T41.45XA

## 2013-09-18 HISTORY — DX: Other complications of anesthesia, initial encounter: T88.59XA

## 2013-09-18 LAB — TYPE AND SCREEN: ABO/RH(D): A POS

## 2013-09-18 LAB — CBC
Hemoglobin: 12.5 g/dL (ref 12.0–15.0)
MCH: 31.1 pg (ref 26.0–34.0)
MCV: 91.5 fL (ref 78.0–100.0)
RBC: 4.02 MIL/uL (ref 3.87–5.11)

## 2013-09-18 LAB — ABO/RH: ABO/RH(D): A POS

## 2013-09-18 LAB — BASIC METABOLIC PANEL
CO2: 22 mEq/L (ref 19–32)
Creatinine, Ser: 0.75 mg/dL (ref 0.50–1.10)
GFR calc non Af Amer: >90 mL/min (ref >90)
Glucose, Bld: 98 mg/dL (ref 70–99)
Potassium: 4.1 mEq/L (ref 3.5–5.1)
Sodium: 141 mEq/L (ref 135–145)

## 2013-09-18 LAB — SURGICAL PCR SCREEN: MRSA, PCR: NEGATIVE

## 2013-09-18 NOTE — Progress Notes (Signed)
No comparison EKG in Epic,requested a comparison EKG from PCP and results from a Sleep study done 2002.
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
## 2013-09-23 ENCOUNTER — Ambulatory Visit
Admission: RE | Admit: 2013-09-23 | Discharge: 2013-09-23 | Disposition: A | Discharge status: Home / Self Care | Payer: BC Managed Care – PPO | Source: Ambulatory Visit

## 2013-09-23 DIAGNOSIS — Z1231 Encounter for screening mammogram for malignant neoplasm of breast: Secondary | ICD-10-CM

## 2013-09-28 MED ORDER — CEFAZOLIN SODIUM-DEXTROSE 2-3 GM-% IV SOLR
2.0000 g | INTRAVENOUS | Status: AC
  Administered 2013-09-29 (×2): 2 g via INTRAVENOUS
  Filled 2013-09-28: qty 50

## 2013-09-29 ENCOUNTER — Encounter (HOSPITAL_COMMUNITY)
Admission: RE | Disposition: A | Discharge status: Home / Self Care | Payer: BC Managed Care – PPO | Source: Ambulatory Visit | Attending: Neurosurgery

## 2013-09-29 ENCOUNTER — Encounter (HOSPITAL_COMMUNITY): Payer: BC Managed Care – PPO | Admitting: Vascular Surgery

## 2013-09-29 ENCOUNTER — Inpatient Hospital Stay (HOSPITAL_COMMUNITY)
Admission: RE | Admit: 2013-09-29 | Discharge: 2013-10-02 | DRG: 460 | Disposition: A | Discharge status: Home / Self Care | Payer: BC Managed Care – PPO | Source: Ambulatory Visit | Attending: Neurosurgery | Admitting: Neurosurgery

## 2013-09-29 ENCOUNTER — Ambulatory Visit (HOSPITAL_COMMUNITY): Payer: BC Managed Care – PPO | Admitting: Anesthesiology

## 2013-09-29 ENCOUNTER — Ambulatory Visit (HOSPITAL_COMMUNITY): Payer: BC Managed Care – PPO

## 2013-09-29 ENCOUNTER — Encounter (HOSPITAL_COMMUNITY): Payer: Self-pay | Admitting: *Deleted

## 2013-09-29 DIAGNOSIS — M5126 Other intervertebral disc displacement, lumbar region: Principal | ICD-10-CM | POA: Diagnosis present

## 2013-09-29 DIAGNOSIS — Z87891 Personal history of nicotine dependence: Secondary | ICD-10-CM

## 2013-09-29 DIAGNOSIS — M48061 Spinal stenosis, lumbar region without neurogenic claudication: Secondary | ICD-10-CM

## 2013-09-29 DIAGNOSIS — I10 Essential (primary) hypertension: Secondary | ICD-10-CM | POA: Diagnosis present

## 2013-09-29 SURGERY — POSTERIOR LUMBAR FUSION 2 LEVEL
Anesthesia: General | Site: Back

## 2013-09-29 MED ORDER — HYDROMORPHONE HCL PF 1 MG/ML IJ SOLN
INTRAMUSCULAR | Status: AC
  Filled 2013-09-29: qty 1

## 2013-09-29 MED ORDER — DEXAMETHASONE 4 MG PO TABS
4.0000 mg | ORAL_TABLET | Freq: Four times a day (QID) | ORAL | Status: AC
  Administered 2013-09-29: 4 mg via ORAL
  Filled 2013-09-29 (×2): qty 1

## 2013-09-29 MED ORDER — LACTATED RINGERS IV SOLN
INTRAVENOUS | Status: DC | PRN
  Administered 2013-09-29 (×3): via INTRAVENOUS

## 2013-09-29 MED ORDER — MENTHOL 3 MG MT LOZG
1.0000 | LOZENGE | OROMUCOSAL | Status: DC | PRN
  Administered 2013-09-30: 3 mg via ORAL
  Filled 2013-09-29: qty 9

## 2013-09-29 MED ORDER — CYCLOBENZAPRINE HCL 10 MG PO TABS
ORAL_TABLET | ORAL | Status: AC
  Filled 2013-09-29: qty 1

## 2013-09-29 MED ORDER — ALUM & MAG HYDROXIDE-SIMETH 200-200-20 MG/5ML PO SUSP: 30.0000 mL | Freq: Four times a day (QID) | ORAL | Status: DC | PRN

## 2013-09-29 MED ORDER — ONDANSETRON HCL 4 MG/2ML IJ SOLN
INTRAMUSCULAR | Status: DC | PRN
  Administered 2013-09-29: 4 mg via INTRAVENOUS

## 2013-09-29 MED ORDER — HYDROMORPHONE HCL PF 1 MG/ML IJ SOLN
1.0000 mg | INTRAMUSCULAR | Status: DC | PRN
  Administered 2013-09-29: 1.5 mg via INTRAMUSCULAR
  Administered 2013-09-30: 1 mg via INTRAMUSCULAR
  Filled 2013-09-29: qty 2
  Filled 2013-09-29: qty 1

## 2013-09-29 MED ORDER — CEFAZOLIN SODIUM 1-5 GM-% IV SOLN
INTRAVENOUS | Status: AC
  Filled 2013-09-29: qty 50

## 2013-09-29 MED ORDER — CEFAZOLIN SODIUM 1-5 GM-% IV SOLN
INTRAVENOUS | Status: AC
Start: 2013-09-29 — End: 2013-09-30
  Filled 2013-09-29: qty 50

## 2013-09-29 MED ORDER — VECURONIUM BROMIDE 10 MG IV SOLR
INTRAVENOUS | Status: DC | PRN
  Administered 2013-09-29 (×2): 1 mg via INTRAVENOUS
  Administered 2013-09-29 (×2): 2 mg via INTRAVENOUS
  Administered 2013-09-29 (×2): 1 mg via INTRAVENOUS

## 2013-09-29 MED ORDER — ONDANSETRON HCL 4 MG/2ML IJ SOLN: 4.0000 mg | Freq: Once | INTRAMUSCULAR | Status: DC | PRN

## 2013-09-29 MED ORDER — OXYCODONE-ACETAMINOPHEN 5-325 MG PO TABS
ORAL_TABLET | ORAL | Status: AC
  Filled 2013-09-29: qty 2

## 2013-09-29 MED ORDER — NEOSTIGMINE METHYLSULFATE 1 MG/ML IJ SOLN
INTRAMUSCULAR | Status: DC | PRN
  Administered 2013-09-29: 5 mg via INTRAVENOUS

## 2013-09-29 MED ORDER — 0.9 % SODIUM CHLORIDE (POUR BTL) OPTIME
TOPICAL | Status: DC | PRN
  Administered 2013-09-29 (×2): 1000 mL

## 2013-09-29 MED ORDER — PANTOPRAZOLE SODIUM 40 MG IV SOLR
40.0000 mg | Freq: Every day | INTRAVENOUS | Status: DC
  Administered 2013-09-29: 40 mg via INTRAVENOUS
  Filled 2013-09-29 (×2): qty 40

## 2013-09-29 MED ORDER — MIDAZOLAM HCL 5 MG/5ML IJ SOLN
INTRAMUSCULAR | Status: DC | PRN
  Administered 2013-09-29: 2 mg via INTRAVENOUS

## 2013-09-29 MED ORDER — SODIUM CHLORIDE 0.9 % IJ SOLN
3.0000 mL | Freq: Two times a day (BID) | INTRAMUSCULAR | Status: DC
  Administered 2013-09-30: 3 mL via INTRAVENOUS

## 2013-09-29 MED ORDER — SODIUM CHLORIDE 0.9 % IV SOLN: 250.0000 mL | INTRAVENOUS | Status: DC

## 2013-09-29 MED ORDER — GLYCOPYRROLATE 0.2 MG/ML IJ SOLN
INTRAMUSCULAR | Status: DC | PRN
  Administered 2013-09-29: .6 mg via INTRAVENOUS

## 2013-09-29 MED ORDER — SUFENTANIL CITRATE 50 MCG/ML IV SOLN
INTRAVENOUS | Status: DC | PRN
  Administered 2013-09-29 (×2): 10 ug via INTRAVENOUS
  Administered 2013-09-29: 5 ug via INTRAVENOUS
  Administered 2013-09-29: 20 ug via INTRAVENOUS
  Administered 2013-09-29: 5 ug via INTRAVENOUS
  Administered 2013-09-29: 10 ug via INTRAVENOUS
  Administered 2013-09-29 (×4): 5 ug via INTRAVENOUS
  Administered 2013-09-29: 10 ug via INTRAVENOUS
  Administered 2013-09-29 (×2): 5 ug via INTRAVENOUS

## 2013-09-29 MED ORDER — PHENYLEPHRINE HCL 10 MG/ML IJ SOLN
20.0000 mg | INTRAVENOUS | Status: DC | PRN
  Administered 2013-09-29: 20 ug/min via INTRAVENOUS
  Administered 2013-09-29: 40 ug/min via INTRAVENOUS

## 2013-09-29 MED ORDER — PHENOL 1.4 % MT LIQD: 1.0000 | OROMUCOSAL | Status: DC | PRN

## 2013-09-29 MED ORDER — HYDROMORPHONE HCL PF 1 MG/ML IJ SOLN
0.2500 mg | INTRAMUSCULAR | Status: DC | PRN
  Administered 2013-09-29 (×4): 0.5 mg via INTRAVENOUS

## 2013-09-29 MED ORDER — DEXAMETHASONE SODIUM PHOSPHATE 4 MG/ML IJ SOLN
4.0000 mg | Freq: Four times a day (QID) | INTRAMUSCULAR | Status: AC
  Administered 2013-09-30: 4 mg via INTRAVENOUS
  Filled 2013-09-29: qty 1

## 2013-09-29 MED ORDER — KCL IN DEXTROSE-NACL 20-5-0.45 MEQ/L-%-% IV SOLN
80.0000 mL/h | INTRAVENOUS | Status: DC
  Administered 2013-09-29 – 2013-10-01 (×4): 80 mL/h via INTRAVENOUS
  Filled 2013-09-29 (×8): qty 1000

## 2013-09-29 MED ORDER — ACETAMINOPHEN 650 MG RE SUPP: 650.0000 mg | RECTAL | Status: DC | PRN

## 2013-09-29 MED ORDER — BUPIVACAINE HCL (PF) 0.5 % IJ SOLN
INTRAMUSCULAR | Status: DC | PRN
  Administered 2013-09-29: 30 mL

## 2013-09-29 MED ORDER — ALBUMIN HUMAN 5 % IV SOLN
INTRAVENOUS | Status: DC | PRN
  Administered 2013-09-29: 10:00:00 via INTRAVENOUS

## 2013-09-29 MED ORDER — SODIUM CHLORIDE 0.9 % IJ SOLN: 3.0000 mL | INTRAMUSCULAR | Status: DC | PRN

## 2013-09-29 MED ORDER — ROCURONIUM BROMIDE 100 MG/10ML IV SOLN
INTRAVENOUS | Status: DC | PRN
  Administered 2013-09-29: 10 mg via INTRAVENOUS
  Administered 2013-09-29: 5 mg via INTRAVENOUS
  Administered 2013-09-29 (×2): 10 mg via INTRAVENOUS
  Administered 2013-09-29: 55 mg via INTRAVENOUS
  Administered 2013-09-29: 10 mg via INTRAVENOUS

## 2013-09-29 MED ORDER — LISINOPRIL 20 MG PO TABS
20.0000 mg | ORAL_TABLET | Freq: Every day | ORAL | Status: DC
  Administered 2013-09-29: 20 mg via ORAL
  Filled 2013-09-29 (×2): qty 1

## 2013-09-29 MED ORDER — DEXAMETHASONE SODIUM PHOSPHATE 10 MG/ML IJ SOLN
INTRAMUSCULAR | Status: AC
  Administered 2013-09-29: 10 mg via INTRAVENOUS
  Filled 2013-09-29: qty 1

## 2013-09-29 MED ORDER — CYCLOBENZAPRINE HCL 10 MG PO TABS
10.0000 mg | ORAL_TABLET | Freq: Three times a day (TID) | ORAL | Status: DC | PRN
  Administered 2013-09-29 – 2013-10-01 (×4): 10 mg via ORAL
  Filled 2013-09-29 (×4): qty 1

## 2013-09-29 MED ORDER — OXYCODONE-ACETAMINOPHEN 5-325 MG PO TABS
1.0000 | ORAL_TABLET | ORAL | Status: DC | PRN
  Administered 2013-09-29 (×2): 2 via ORAL
  Administered 2013-09-30: 1 via ORAL
  Administered 2013-09-30 – 2013-10-02 (×9): 2 via ORAL
  Filled 2013-09-29 (×7): qty 2
  Filled 2013-09-29: qty 1
  Filled 2013-09-29 (×4): qty 2

## 2013-09-29 MED ORDER — LISINOPRIL-HYDROCHLOROTHIAZIDE 20-25 MG PO TABS: 1.0000 | ORAL_TABLET | Freq: Every day | ORAL | Status: DC

## 2013-09-29 MED ORDER — ACETAMINOPHEN 325 MG PO TABS: 650.0000 mg | ORAL_TABLET | ORAL | Status: DC | PRN

## 2013-09-29 MED ORDER — CEFAZOLIN SODIUM-DEXTROSE 2-3 GM-% IV SOLR
2.0000 g | Freq: Three times a day (TID) | INTRAVENOUS | Status: AC
  Administered 2013-09-29 – 2013-09-30 (×3): 2 g via INTRAVENOUS
  Filled 2013-09-29 (×3): qty 50

## 2013-09-29 MED ORDER — LIDOCAINE HCL (CARDIAC) 20 MG/ML IV SOLN
INTRAVENOUS | Status: DC | PRN
  Administered 2013-09-29: 40 mg via INTRAVENOUS

## 2013-09-29 MED ORDER — PROPOFOL 10 MG/ML IV BOLUS
INTRAVENOUS | Status: DC | PRN
  Administered 2013-09-29: 130 mg via INTRAVENOUS

## 2013-09-29 MED ORDER — HYDROCHLOROTHIAZIDE 25 MG PO TABS
25.0000 mg | ORAL_TABLET | Freq: Every day | ORAL | Status: DC
  Administered 2013-09-29 – 2013-09-30 (×2): 25 mg via ORAL
  Filled 2013-09-29 (×4): qty 1

## 2013-09-29 MED ORDER — DEXAMETHASONE SODIUM PHOSPHATE 10 MG/ML IJ SOLN: 10.0000 mg | INTRAMUSCULAR | Status: DC

## 2013-09-29 MED ORDER — SODIUM CHLORIDE 0.9 % IR SOLN
Status: DC | PRN
  Administered 2013-09-29: 09:00:00

## 2013-09-29 MED ORDER — THROMBIN 20000 UNITS EX SOLR
CUTANEOUS | Status: DC | PRN
  Administered 2013-09-29 (×2): via TOPICAL

## 2013-09-29 MED ORDER — ONDANSETRON HCL 4 MG/2ML IJ SOLN: 4.0000 mg | INTRAMUSCULAR | Status: DC | PRN

## 2013-09-29 SURGICAL SUPPLY — 62 items
BAG DECANTER FOR FLEXI CONT (MISCELLANEOUS) ×2 IMPLANT
BENZOIN TINCTURE PRP APPL 2/3 (GAUZE/BANDAGES/DRESSINGS) ×4 IMPLANT
BLADE SURG ROTATE 9660 (MISCELLANEOUS) IMPLANT
BONE EQUIVA 10CC (Bone Implant) ×4 IMPLANT
BRUSH SCRUB EZ PLAIN DRY (MISCELLANEOUS) ×2 IMPLANT
BUR CUTTER 7.0 ROUND (BURR) ×6 IMPLANT
BUR MATCHSTICK NEURO 3.0 LAGG (BURR) ×2 IMPLANT
CANISTER SUCT 3000ML (MISCELLANEOUS) ×2 IMPLANT
CONT SPEC 4OZ CLIKSEAL STRL BL (MISCELLANEOUS) ×4 IMPLANT
COVER BACK TABLE 24X17X13 BIG (DRAPES) IMPLANT
COVER TABLE BACK 60X90 (DRAPES) ×2 IMPLANT
DERMABOND ADVANCED (GAUZE/BANDAGES/DRESSINGS) ×2
DERMABOND ADVANCED .7 DNX12 (GAUZE/BANDAGES/DRESSINGS) ×2 IMPLANT
DRAPE C-ARM 42X72 X-RAY (DRAPES) ×4 IMPLANT
DRAPE LAPAROTOMY 100X72X124 (DRAPES) ×2 IMPLANT
DRAPE SURG 17X23 STRL (DRAPES) ×4 IMPLANT
DRESSING TELFA 8X3 (GAUZE/BANDAGES/DRESSINGS) IMPLANT
DURAPREP 26ML APPLICATOR (WOUND CARE) ×2 IMPLANT
ELECT REM PT RETURN 9FT ADLT (ELECTROSURGICAL) ×2
ELECTRODE REM PT RTRN 9FT ADLT (ELECTROSURGICAL) ×1 IMPLANT
EVACUATOR 1/8 PVC DRAIN (DRAIN) ×2 IMPLANT
GAUZE SPONGE 4X4 16PLY XRAY LF (GAUZE/BANDAGES/DRESSINGS) ×2 IMPLANT
GLOVE BIOGEL PI IND STRL 8 (GLOVE) ×1 IMPLANT
GLOVE BIOGEL PI IND STRL 8.5 (GLOVE) ×1 IMPLANT
GLOVE BIOGEL PI INDICATOR 8 (GLOVE) ×1
GLOVE BIOGEL PI INDICATOR 8.5 (GLOVE) ×1
GLOVE ECLIPSE 8.0 STRL XLNG CF (GLOVE) ×6 IMPLANT
GOWN BRE IMP SLV AUR LG STRL (GOWN DISPOSABLE) IMPLANT
GOWN BRE IMP SLV AUR XL STRL (GOWN DISPOSABLE) ×8 IMPLANT
GOWN STRL REIN 2XL LVL4 (GOWN DISPOSABLE) IMPLANT
IMPLANT ARDIS PEEK 8X9X22 (Orthopedic Implant) ×8 IMPLANT
KIT BASIN OR (CUSTOM PROCEDURE TRAY) ×2 IMPLANT
KIT ROOM TURNOVER OR (KITS) ×2 IMPLANT
NEEDLE HYPO 22GX1.5 SAFETY (NEEDLE) ×2 IMPLANT
NS IRRIG 1000ML POUR BTL (IV SOLUTION) ×2 IMPLANT
PACK LAMINECTOMY NEURO (CUSTOM PROCEDURE TRAY) ×2 IMPLANT
PAD ARMBOARD 7.5X6 YLW CONV (MISCELLANEOUS) ×6 IMPLANT
PATTIES SURGICAL .75X.75 (GAUZE/BANDAGES/DRESSINGS) ×2 IMPLANT
ROD BENT 65MM (Rod) ×2 IMPLANT
ROD PERBENT 70MM (Rod) ×2 IMPLANT
SCREW POLY 6.5X40MM (Screw) ×8 IMPLANT
SCREW SEQUOIA 6.5X35MM (Screw) ×2 IMPLANT
SCREW SEQUOIA 7.5X35MM (Screw) ×2 IMPLANT
SCREW SEQUOIA 7.5X40 (Screw) ×2 IMPLANT
SPEEDLINK 11 MEDIUM (Rod) ×2 IMPLANT
SPONGE GAUZE 4X4 12PLY (GAUZE/BANDAGES/DRESSINGS) ×2 IMPLANT
SPONGE LAP 4X18 X RAY DECT (DISPOSABLE) IMPLANT
SPONGE SURGIFOAM ABS GEL 100 (HEMOSTASIS) ×4 IMPLANT
STRIP CLOSURE SKIN 1/2X4 (GAUZE/BANDAGES/DRESSINGS) ×4 IMPLANT
SUT PROLENE 0 CT 1 30 (SUTURE) ×2 IMPLANT
SUT VIC AB 0 CT1 18XCR BRD8 (SUTURE) ×2 IMPLANT
SUT VIC AB 0 CT1 8-18 (SUTURE) ×2
SUT VIC AB 2-0 OS6 18 (SUTURE) ×6 IMPLANT
SUT VIC AB 3-0 CP2 18 (SUTURE) ×4 IMPLANT
SYR 20ML ECCENTRIC (SYRINGE) ×2 IMPLANT
TAPE CLOTH SURG 4X10 WHT LF (GAUZE/BANDAGES/DRESSINGS) ×2 IMPLANT
TOP CLSR SEQUOIA (Orthopedic Implant) ×12 IMPLANT
TOWEL OR 17X24 6PK STRL BLUE (TOWEL DISPOSABLE) ×2 IMPLANT
TOWEL OR 17X26 10 PK STRL BLUE (TOWEL DISPOSABLE) ×2 IMPLANT
TRAP SPECIMEN MUCOUS 40CC (MISCELLANEOUS) ×2 IMPLANT
TRAY FOLEY CATH 14FRSI W/METER (CATHETERS) ×2 IMPLANT
WATER STERILE IRR 1000ML POUR (IV SOLUTION) ×2 IMPLANT

## 2013-09-29 NOTE — H&P (Signed)
  Tanya Patton is an 57 y.o. female.   Chief Complaint: Back and bilateral leg pain HPI: The patient is a 57 year old female whose been followed in the office for a long time with back and bilateral leg pain worse on the right than the left. She's had previous surgery in the past on her lumbar spine we'll the laminotomy and did well for a number of years. Now she complains of increasing pain in her back which goes down the legs. Is worse with walking. She's tried aggressive conservative therapy including injections medications and therapy all without relief. She's had an MRI scan which shows spondylolisthesis L4-5 and a diffuse disc herniation at L5-S1. After discussing the options the patient has requested surgery and now comes for a two-level posterior lumbar interbody fusion with instrumentation. I had a long discussion with her regarding the risks and benefits of surgical intervention. The risks discussed include but are not limited to bleeding infection weakness seems paralysis spinal fluid leak trouble with instrumentation nonunion coma and death. We have discussed alternative methods of therapy although risks and benefits of nonintervention. She's had the opportunity to ask numerous questions and appears to understand. With this information in hand she has requested we proceed with surgery.  Past Medical History  Diagnosis Date  . Spinal stenosis   . Hypertension   . Complication of anesthesia     woke up fighting    Past Surgical History  Procedure Laterality Date  . Back surgery    . Colonoscopy  2011    No family history on file. Social History:  reports that she quit smoking about 6 weeks ago. Her smoking use included Cigarettes. She has a 30 pack-year smoking history. She has never used smokeless tobacco. She reports that she does not drink alcohol or use illicit drugs.  Allergies:  Allergies  Allergen Reactions  . Atorvastatin     REACTION: jittery  . Shellfish Allergy  Hives and Itching    Medications Prior to Admission  Medication Sig Dispense Refill  . HYDROcodone-acetaminophen (NORCO/VICODIN) 5-325 MG per tablet Take 1 tablet by mouth every 4 (four) hours.      Marland Kitchen lisinopril-hydrochlorothiazide (PRINZIDE,ZESTORETIC) 20-25 MG per tablet Take 1 tablet by mouth daily.        No results found for this or any previous visit (from the past 48 hour(s)). No results found.  Review of systems not obtained due to patient factors.  Blood pressure 118/78, pulse 81, temperature 97.9 F (36.6 C), temperature source Oral, SpO2 99.00%.  The patient is awake or and oriented. She is no facial asymmetry. Reflexes lower extremities are decreased but equal and she is normal strength and sensation Assessment/Plan Impression is that of spondylolisthesis L4-5-1 diffuse disc herniation at L5-S1 with stenosis. The plan is for a two-level posterior lumbar interbody fusion with pedicle screw fixation.  Reinaldo Meeker, MD 09/29/2013, 7:36 AM

## 2013-09-29 NOTE — Anesthesia Preprocedure Evaluation (Addendum)
Anesthesia Evaluation  Patient identified by MRN, date of birth, ID band Patient awake    Reviewed: Allergy & Precautions, H&P , NPO status , Patient's Chart, lab work & pertinent test results  Airway Mallampati: II TM Distance: >3 FB Neck ROM: Full    Dental  (+) Edentulous Upper   Pulmonary former smoker,  breath sounds clear to auscultation        Cardiovascular hypertension, Rhythm:Regular Rate:Normal     Neuro/Psych    GI/Hepatic   Endo/Other    Renal/GU      Musculoskeletal   Abdominal   Peds  Hematology   Anesthesia Other Findings   Reproductive/Obstetrics                          Anesthesia Physical Anesthesia Plan  ASA: III  Anesthesia Plan: General   Post-op Pain Management:    Induction: Intravenous  Airway Management Planned: Oral ETT  Additional Equipment:   Intra-op Plan:   Post-operative Plan: Extubation in OR  Informed Consent: I have reviewed the patients History and Physical, chart, labs and discussed the procedure including the risks, benefits and alternatives for the proposed anesthesia with the patient or authorized representative who has indicated his/her understanding and acceptance.   Dental advisory given  Plan Discussed with: CRNA and Anesthesiologist  Anesthesia Plan Comments: (Lumbar spondylolisthesis Htn H/O smoking H/O chest pain 2012, none since saw Dr. Roanna Banning, MD )        Anesthesia Quick Evaluation

## 2013-09-29 NOTE — Anesthesia Procedure Notes (Signed)
Procedure Name: Intubation Date/Time: 09/29/2013 7:54 AM Performed by: Coralee Rud Pre-anesthesia Checklist: Patient identified, Emergency Drugs available, Suction available and Patient being monitored Patient Re-evaluated:Patient Re-evaluated prior to inductionOxygen Delivery Method: Circle system utilized Preoxygenation: Pre-oxygenation with 100% oxygen Intubation Type: IV induction Ventilation: Mask ventilation without difficulty and Oral airway inserted - appropriate to patient size Laryngoscope Size: Miller and 3 Grade View: Grade I Tube type: Oral Tube size: 7.5 mm Number of attempts: 1 Airway Equipment and Method: Stylet Placement Confirmation: ETT inserted through vocal cords under direct vision,  positive ETCO2 and breath sounds checked- equal and bilateral Secured at: 20 cm Tube secured with: Tape Dental Injury: Teeth and Oropharynx as per pre-operative assessment

## 2013-09-29 NOTE — Op Note (Signed)
Preop diagnosis: Spondylolisthesis L4-5 with spinal stenosis and spinal stenosis L5-S1, multifactorial Postop diagnosis: Same Procedure: Bilateral L4-5 L5-S1 decompressive laminectomy Bilateral L4-5 L5-S1 microdiscectomy L4-5 L5-S1 posterior lumbar interbody fusion with peek interbody spacers L4-5 L5-S1 segmental instrumentation with [employer] pedicle screw system L4-5 L5-S1 posterolateral fusion Surgeon: Romel Dumond Assistant: Venetia Maxon  After and placed the prone position the patient's back was prepped and draped in usual sterile fashion. Midline incision was made above the spinous processes of L3 L4-L5 and S1. Using Bovie cutting current the incision was carried on the spinous processes. Subperiosteal dissection was then carried out bilaterally on the spinous processes lamina facet joint and the far lateral region to identify the transverse processes of L4-L5 and the far lateral region of the sacrum. Self-retaining tract was placed for exposure and x-ray showed approach the appropriate levels. Spinous processes were removed. Starting the patient's left side laminotomies were performed at L4 and L5 by an L5-S1. Medial three quarters of the facet joint was removed on the left side at both levels. Hypertrophic ligamentum flavum was removed. Similar decompression was then carried on the patient's right side and residual midline structures were removed to complete the bilateral decompressive laminectomy. L4-L5 and S1 nerve roots were identified and decompressed more so than needed for interbody fusion. Disc spaces at L4-5 and L5-S1 bilaterally were identified entered and thoroughly cleaned out with pituitary rongeurs and curettes. The disc spaces were then prepared for interbody fusion. Starting at L4-5 the disc was distracted up to an 8 mm size this was felt to be a good choice. We divided rotating cutters and endplate cleaners to prepare the disc for interbody fusion. We then impacted an 8 x 9 x 22 mm peek interbody  spacer filled with a mixture of autologous bone morselized allograft. Similar procedure was carried on the opposite side. Prior to placing the second spacer autologous bone morselized allograft were placed deep within the interspace to help with interbody fusion. Similar interbody fusion was then carried out at L5-S1. Once again the disc space was distracted up to a 10 mm size this is for to be a good choice. Once again endplates were prepared for interbody fusion with a variety of instruments. Once again placed and 8 x 9 x 20 mm cage on the first side once again filled with morselized a bone autograft. On the opposite side similar procedure was carried out. Prior to placing the second spacer autologous bone morselized allograft were placed into the interspace to help with interbody fusion. We then placed pedicle screws bilaterally at L4-L5 and S1. We used drill hole entry points passed the pedicle all tapped with the 5.5 mm tap and then placed 6.5 x 40 mm screws at L4 and L5 bilaterally and a S1 we placed 6.5 x 35 mm screws. We adjusted the screw on the right at L4 this was too lateral initially and then the screws 1 excellent position. We irrigated copiously metabolic irrigation. Decorticated the far lateral region placed a mixture of autologous bone morselized allograft for posterolateral fusion. We then placed appropriate length rods. With the top loading nuts and tightened final tightening with torque and counter torque. We placed a cross-link. Final fluoroscopy with excellent in AP lateral direction. The was then irrigated copiously once more and leakage photocoagulation [place]am. An epidural drain was left in the epidural space and brought out through separate stab incision. The was then closed in multiple layers of Vicryl on the muscle fascia subcutaneous and subcuticular tissues. A running locking  Prolene was placed on the skin. Shortness was then applied the patient was extubated and taken to recovery in  stable condition.

## 2013-09-29 NOTE — Anesthesia Postprocedure Evaluation (Signed)
  Anesthesia Post-op Note  Patient: Tanya Patton  Procedure(s) Performed: Procedure(s): POSTERIOR LUMBAR INTERBODY FUSION LUMBAR FOUR/FIVE LUMBAR FIVE/ SACRAL ONE  (N/A)  Patient Location: PACU  Anesthesia Type:General  Level of Consciousness: awake, alert  and oriented  Airway and Oxygen Therapy: Patient Spontanous Breathing and Patient connected to nasal cannula oxygen  Post-op Pain: mild  Post-op Assessment: Post-op Vital signs reviewed, Patient's Cardiovascular Status Stable, Respiratory Function Stable, Patent Airway and Pain level controlled  Post-op Vital Signs: Stable  Complications: No apparent anesthesia complications

## 2013-09-29 NOTE — Transfer of Care (Signed)
Immediate Anesthesia Transfer of Care Note  Patient: Tanya Patton  Procedure(s) Performed: Procedure(s): POSTERIOR LUMBAR INTERBODY FUSION LUMBAR FOUR/FIVE LUMBAR FIVE/ SACRAL ONE  (N/A)  Patient Location: PACU  Anesthesia Type:General  Level of Consciousness: awake, sedated and patient cooperative  Airway & Oxygen Therapy: Patient Spontanous Breathing and Patient connected to face mask oxygen  Post-op Assessment: Report given to PACU RN, Post -op Vital signs reviewed and stable and Patient moving all extremities  Post vital signs: Reviewed and stable  Complications: No apparent anesthesia complications

## 2013-09-30 MED ORDER — PANTOPRAZOLE SODIUM 40 MG PO TBEC
40.0000 mg | DELAYED_RELEASE_TABLET | Freq: Every day | ORAL | Status: DC
  Administered 2013-09-30 – 2013-10-02 (×3): 40 mg via ORAL
  Filled 2013-09-30 (×2): qty 1

## 2013-09-30 MED ORDER — LACTATED RINGERS IV BOLUS (SEPSIS)
500.0000 mL | Freq: Once | INTRAVENOUS | Status: AC
  Administered 2013-09-30: 500 mL via INTRAVENOUS

## 2013-09-30 MED ORDER — LISINOPRIL 10 MG PO TABS
10.0000 mg | ORAL_TABLET | Freq: Every day | ORAL | Status: DC
  Administered 2013-09-30: 10 mg via ORAL
  Filled 2013-09-30 (×3): qty 1

## 2013-09-30 NOTE — Progress Notes (Signed)
Physical Therapy Evaluation Patient Details Name: Tanya Patton MRN: [id number] DOB: 1956/07/29 Today's Date: 09/30/2013 Time: 4098-1191 PT Time Calculation (min): 16 min   History of Present Illness  Patient is a 57 yo female s/p POSTERIOR LUMBAR INTERBODY FUSION LUMBAR FOUR/FIVE LUMBAR FIVE/ SACRAL ONE .     Home setup information obtained. Patient educated at length regarding back precautions (handout provided), could not perform mobility assessment at this time secondary to no back brace present. Will follow up later this am, once brace is available.   09/30/13 0800  PT Visit Information  Last PT Received On 09/30/13  History of Present Illness Patient is a 57 yo female s/p POSTERIOR LUMBAR INTERBODY FUSION LUMBAR FOUR/FIVE LUMBAR FIVE/ SACRAL ONE .  Precautions  Precautions Back  Precaution Booklet Issued Yes (comment)  Precaution Comments Educated and reviewed spinal precaution handout with patient in detail.  Required Braces or Orthoses Spinal Brace  Spinal Brace Lumbar corset  Home Living  Family/patient expects to be discharged to: Private residence  Living Arrangements Children  Available Help at Discharge Family  Type of Home House  Home Access Level entry  Home Layout One level  Home Equipment [place] - single point  Additional Comments tub shower with curtain, standard toilets  Prior Function  Level of Independence Independent  Communication  Communication No difficulties  Cognition  Arousal/Alertness Awake/alert  Behavior During Therapy [facility] for tasks assessed/performed  Upper Extremity Assessment  Upper Extremity Assessment Defer to OT evaluation  Lower Extremity Assessment  Lower Extremity Assessment Overall WFL for tasks assessed  Bed Mobility  Bed Mobility Not assessed  Transfers  Transfers Not assessed  Ambulation/Gait  Ambulation/Gait Assistance Not tested (comment)  PT - End of Session  Patient left in bed;with call bell/phone within reach;with  bed alarm set  Nurse Communication Other (comment) (need for back brace)  PT Assessment  PT Recommendation/Assessment Patient needs continued PT services  PT Problem List Decreased activity tolerance;Decreased knowledge of precautions;Pain  PT Therapy Diagnosis  Difficulty walking;Acute pain  PT Plan  PT Frequency Min 5X/week  PT Treatment/Interventions DME instruction;Gait training;Functional mobility training;Therapeutic activities;Therapeutic exercise;Balance training;Patient/family education  PT Recommendation  Follow Up Recommendations (TBD)  PT equipment (TBD)  Acute Rehab PT Goals  Patient Stated Goal to go home with family  PT Goal Formulation With patient  Time For Goal Achievement 10/14/13  PT Time Calculation  PT Start Time 0818  PT Stop Time 0834  PT Time Calculation (min) 16 min  PT General Charges  $$ ACUTE PT VISIT 1 Procedure  PT Evaluation  $Initial PT Evaluation Tier I 1 Procedure  PT Treatments  $Self Care/Home Management 8-22  Written Expression  Dominant Hand Right    Charlotte Crumb, PT DPT  [phone]

## 2013-09-30 NOTE — Progress Notes (Signed)
Orthopedic Tech Progress Note Patient Details:  Tanya Patton Nov 26, 1955 [id number]  Patient ID: [name], female   DOB: 1956-10-24, 57 y.o.   MRN: [id number]   [name] 09/30/2013, 11:13 AMCalled bio-tech for lumbar corset.

## 2013-09-30 NOTE — Progress Notes (Signed)
   CARE MANAGEMENT NOTE 09/30/2013  Patient:  Tanya Patton, Tanya Patton   Account Number:  [id number]  Date Initiated:  09/30/2013  Documentation initiated by:  Jiles Crocker  Subjective/Objective Assessment:   ADMITTED FOR BACK  SURGERY     Action/Plan:   CM FOLLOWING FOR DCP   Anticipated DC Date:  10/03/2013   Anticipated DC Plan:  POSSIBLY HOME W HOME HEALTH SERVICES, AWAITING FOR PT/OT EVALS FOR DC NEEDS;     DC Planning Services  CM consult          Status of service:  In process, will continue to follow Medicare Important Message given?  NA - LOS <3 / Initial given by admissions (If response is "NO", the following Medicare IM given date fields will be blank)  Per UR Regulation:  Reviewed for med. necessity/level of care/duration of stay  Comments:  12/3/2014Abelino Derrick RN,BSN,MHA [phone]

## 2013-09-30 NOTE — Evaluation (Signed)
Physical Therapy Evaluation Patient Details Name: Tanya Patton MRN: [id number] DOB: Apr 06, 1956 Today's Date: 09/30/2013 Time: 4098-1191 PT Time Calculation (min): 24 min  PT Assessment / Plan / Recommendation History of Present Illness  Patient is a 57 yo female s/p POSTERIOR LUMBAR INTERBODY FUSION LUMBAR FOUR/FIVE LUMBAR FIVE/ SACRAL ONE .  Clinical Impression  Patient demonstrates deficits in functional mobility as indicated below. Pt will benefit from continued skilled PT to address deficits and maximize independence. Will continue to see as indicated. rec HHPT upon discharge with family supervision and PRN assist.      PT Assessment  Patient needs continued PT services    Follow Up Recommendations  Home health PT          Equipment Recommendations  Rolling walker with 5" wheels       Frequency Min 5X/week    Precautions / Restrictions Precautions Precautions: Back Precaution Booklet Issued: Yes (comment) Precaution Comments: Educated and reviewed spinal precaution handout with patient in detail. Required Braces or Orthoses: Spinal Brace Spinal Brace: Lumbar corset   Pertinent Vitals/Pain 5/10 pain at this time      Mobility  Bed Mobility Bed Mobility: Rolling Left;Left Sidelying to Sit;Sitting - Scoot to [place] of Bed Rolling Left: 4: Min guard Left Sidelying to Sit: 4: Min assist Sitting - Scoot to [place] of Bed: 4: Min guard Details for Bed Mobility Assistance: VCs for technique with compliance (log roll), assist to elevate trunk to sitting Transfers Transfers: Sit to Stand;Stand to Sit Sit to Stand: 4: Min assist;From bed;From chair/3-in-1;From toilet Stand to Sit: 4: Min assist;To chair/3-in-1;To toilet Details for Transfer Assistance: VCs for hand placement, assist for stability and controlled positioing Ambulation/Gait Ambulation/Gait Assistance: 4: Min guard Ambulation Distance (Feet): 40 Feet Assistive device: Rolling walker Ambulation/Gait  Assistance Details: amb in room, to and from bathroom and across room and back to chair, pt nervous about ambulating but tolerated well., Min Guard for stability and safety Gait Pattern: Step-to pattern;Decreased stride length Gait velocity: decreased General Gait Details: steady but nervous        PT Diagnosis: Difficulty walking;Acute pain  PT Problem List: Decreased activity tolerance;Decreased knowledge of precautions;Pain PT Treatment Interventions: DME instruction;Gait training;Functional mobility training;Therapeutic activities;Therapeutic exercise;Balance training;Patient/family education     PT Goals(Current goals can be found in the care plan section) Acute Rehab PT Goals Patient Stated Goal: to go home with family PT Goal Formulation: With patient Time For Goal Achievement: 10/14/13  Visit Information  Last PT Received On: 09/30/13 Assistance Needed: +1 History of Present Illness: Patient is a 57 yo female s/p POSTERIOR LUMBAR INTERBODY FUSION LUMBAR FOUR/FIVE LUMBAR FIVE/ SACRAL ONE .       Prior Functioning  Home Living Family/patient expects to be discharged to:: Private residence Living Arrangements: Children Available Help at Discharge: Family Type of Home: House Home Access: Level entry Home Layout: One level Home Equipment: Cane - single point Additional Comments: tub shower with curtain, standard toilets Prior Function Level of Independence: Independent Communication Communication: No difficulties Dominant Hand: Right    Cognition  Cognition Arousal/Alertness: Awake/alert Behavior During Therapy: WFL for tasks assessed/performed    Extremity/Trunk Assessment Lower Extremity Assessment Lower Extremity Assessment: Overall WFL for tasks assessed   Balance Static Sitting Balance Static Sitting - Balance Support: Feet supported Static Sitting - Level of Assistance: 7: Independent Static Standing Balance Static Standing - Balance Support: During  functional activity Static Standing - Level of Assistance: 5: Stand by assistance Static Standing - Comment/#  of Minutes: 4 minutes counter height activities, no physical assist required, VCs for positioning with RW at sink High Level Balance High Level Balance Activites: Side stepping;Backward walking;Direction changes;Turns High Level Balance Comments: min guard  End of Session PT - End of Session Equipment Utilized During Treatment: Gait belt;Back brace Activity Tolerance: Patient tolerated treatment well Patient left: in bed;with call bell/phone within reach;with bed alarm set Nurse Communication: Other (comment) (need for back brace)  GP     Fabio Asa 09/30/2013, 1:40 PM  Charlotte Crumb, PT DPT  [phone]

## 2013-09-30 NOTE — Progress Notes (Signed)
Patient ID: Tanya Patton, female   DOB: 04/02/1956, 57 y.o.   MRN: [id number] Afeb, vss, though BP a bit low;  Says she has no leg pain and her back feels pretty good as well. Will give bolus of fluid, and start to increase activity. Drain working well. Will leave in until tomorrow.

## 2013-09-30 NOTE — Evaluation (Signed)
Occupational Therapy Evaluation Patient Details Name: Tanya Patton MRN: [id number] DOB: 08/05/1956 Today's Date: 09/30/2013 Time: 4098-1191 OT Time Calculation (min): 27 min  OT Assessment / Plan / Recommendation History of present illness Patient is a 57 yo female s/p POSTERIOR LUMBAR INTERBODY FUSION LUMBAR FOUR/FIVE LUMBAR FIVE/ SACRAL ONE .   Clinical Impression   Pt presents with below problem list. Pt moving well during session. Pt independent with ADLs, PTA. Pt will benefit from acute OT to increase independence prior to d/c.     OT Assessment  Patient needs continued OT Services    Follow Up Recommendations  No OT follow up;Supervision/Assistance - 24 hour    Barriers to Discharge      Equipment Recommendations  3 in 1 bedside comode;Other (comment) (AE kit)    Recommendations for Other Services    Frequency  Min 2X/week    Precautions / Restrictions Precautions Precautions: Back Precaution Booklet Issued: No Precaution Comments: Pt able to state 2/3 precautions.  Required Braces or Orthoses: Spinal Brace Spinal Brace: Lumbar corset;Applied in sitting position   Pertinent Vitals/Pain Pain 4/10. Repositioned.     ADL  Grooming: Wash/dry hands;Supervision/safety (setup/supervision for other grooming tasks) Where Assessed - Grooming: Supported standing Upper Body Dressing: Other (comment);Minimal assistance (back brace) Where Assessed - Upper Body Dressing: Unsupported sitting Lower Body Dressing: Minimal assistance Where Assessed - Lower Body Dressing: Supported sit to stand Toilet Transfer: Hydrographic surveyor Method: Sit to Barista: Comfort height toilet;Grab bars Toileting - Architect and Hygiene: Minimal assistance Where Assessed - Engineer, mining and Hygiene: Sit to stand from 3-in-1 or toilet Tub/Shower Transfer Method: Science writer: Other (comment) (practiced  stepping over) Equipment Used: Back brace;Gait belt;Rolling walker;Sock aid;Reacher;Long-handled sponge;Long-handled shoe horn Transfers/Ambulation Related to ADLs: Min guard/supervision for ambulation and Min guard for transfers. ADL Comments: Educated on AE for LB ADLs. Pt practiced with sockaid. Educated on use of two cups for teeth care to avoid breaking precautions. Pt practiced simulated tub transfer (stepping over). Educated on toilet aid for hygiene.     OT Diagnosis: Acute pain  OT Problem List: Decreased activity tolerance;Decreased knowledge of precautions;Decreased range of motion;Decreased knowledge of use of DME or AE;Pain OT Treatment Interventions: Self-care/ADL training;DME and/or AE instruction;Therapeutic activities;Patient/family education;Balance training   OT Goals(Current goals can be found in the care plan section) Acute Rehab OT Goals Patient Stated Goal: not stated OT Goal Formulation: With patient Time For Goal Achievement: 10/07/13 Potential to Achieve Goals: Good  Visit Information  Last OT Received On: 09/30/13 Assistance Needed: +1 History of Present Illness: Patient is a 57 yo female s/p POSTERIOR LUMBAR INTERBODY FUSION LUMBAR FOUR/FIVE LUMBAR FIVE/ SACRAL ONE .       Prior Functioning     Home Living Family/patient expects to be discharged to:: Private residence Living Arrangements: Children Available Help at Discharge: Family;Available 24 hours/day Type of Home: House Home Access: Level entry Home Layout: One level Home Equipment: Cane - single point Prior Function Level of Independence: Independent Communication Communication: No difficulties Dominant Hand: Right         Vision/Perception     Cognition  Cognition Arousal/Alertness: Awake/alert Behavior During Therapy: WFL for tasks assessed/performed    Extremity/Trunk Assessment Upper Extremity Assessment Upper Extremity Assessment: Overall WFL for tasks assessed Lower  Extremity Assessment Lower Extremity Assessment: Defer to PT evaluation     Mobility Bed Mobility Bed Mobility: Rolling Left;Left Sidelying to Sit Rolling Left: 4: Min guard  Left Sidelying to Sit: 4: Min guard Details for Bed Mobility Assistance: Cues for technique. Transfers Transfers: Sit to Stand;Stand to Sit Sit to Stand: 4: Min guard;With upper extremity assist;From bed;From toilet;From chair/3-in-1 Stand to Sit: 4: Min guard;With upper extremity assist;To toilet;To chair/3-in-1;To bed Details for Transfer Assistance: Cues for hand placement.     Exercise     Balance     End of Session OT - End of Session Equipment Utilized During Treatment: Gait belt;Rolling walker;Back brace Activity Tolerance: Patient tolerated treatment well Patient left: in bed;with call bell/phone within reach;with family/visitor present  GO     Earlie Raveling OTR/L [phone] 09/30/2013, 5:50 PM

## 2013-10-01 NOTE — Progress Notes (Signed)
Occupational Therapy Treatment Patient Details Name: Tanya Patton MRN: [id number] DOB: Jun 07, 1956 Today's Date: 10/01/2013 Time: 7829-5621 OT Time Calculation (min): 30 min  OT Assessment / Plan / Recommendation  History of present illness Patient is a 57 yo female s/p POSTERIOR LUMBAR INTERBODY FUSION LUMBAR FOUR/FIVE LUMBAR FIVE/ SACRAL ONE .   OT comments  Pt progressing this session and able to complete toilet transfer at supervision level. Pt without further questions at this time. Pt plans to purchase AE kit.   Follow Up Recommendations  No OT follow up;Supervision/Assistance - 24 hour    Barriers to Discharge       Equipment Recommendations  3 in 1 bedside comode;Other (comment)    Recommendations for Other Services    Frequency Min 2X/week   Progress towards OT Goals Progress towards OT goals: Progressing toward goals  Plan Discharge plan remains appropriate    Precautions / Restrictions Precautions Precautions: Back Required Braces or Orthoses: Spinal Brace Spinal Brace: Lumbar corset;Applied in sitting position   Pertinent Vitals/Pain None reported    ADL  Grooming: Wash/dry hands;Supervision/safety Where Assessed - Grooming: Unsupported standing Toilet Transfer: Radiographer, therapeutic Method: Sit to Barista: Raised toilet seat with arms (or 3-in-1 over toilet) Toileting - Clothing Manipulation and Hygiene: Supervision/safety Where Assessed - Engineer, mining and Hygiene: Sit to stand from 3-in-1 or toilet Tub/Shower Transfer: Supervision/safety Tub/Shower Transfer Method: Ambulating Equipment Used: Back brace;Gait belt;Rolling walker Transfers/Ambulation Related to ADLs: pt ambulated to gym supervision level wtih RW ADL Comments: Pt completed tub transfer supervision level. Pt provided 3n1 over toilet to practice and able to perform supervision level. Pt ordering lunch using menu in room with instructions  from Ot. pt progressing well this session. Pt could complete toilet transfer without (A) if IV were d/c    OT Diagnosis:    OT Problem List:   OT Treatment Interventions:     OT Goals(current goals can now be found in the care plan section) Acute Rehab OT Goals Patient Stated Goal: not stated OT Goal Formulation: With patient Time For Goal Achievement: 10/07/13 Potential to Achieve Goals: Good ADL Goals Pt Will Perform Grooming: with modified independence;standing Pt Will Perform Lower Body Bathing: with modified independence;with adaptive equipment;sit to/from stand Pt Will Perform Lower Body Dressing: with modified independence;with adaptive equipment;sit to/from stand Pt Will Transfer to Toilet: with modified independence;ambulating Pt Will Perform Toileting - Clothing Manipulation and hygiene: with modified independence;with adaptive equipment;sit to/from stand Additional ADL Goal #1: Pt will independently verbalize and demonstrate 3/3 back precautions.  Additional ADL Goal #2: Pt will don/doff back brace with setup A.  Visit Information  Last OT Received On: 10/01/13 Assistance Needed: +1 History of Present Illness: Patient is a 57 yo female s/p POSTERIOR LUMBAR INTERBODY FUSION LUMBAR FOUR/FIVE LUMBAR FIVE/ SACRAL ONE .    Subjective Data      Prior Functioning       Cognition  Cognition Arousal/Alertness: Awake/alert Behavior During Therapy: WFL for tasks assessed/performed Overall Cognitive Status: Within Functional Limits for tasks assessed    Mobility  Bed Mobility Bed Mobility: Supine to Sit;Sitting - Scoot to Edge of Bed Rolling Left: 6: Modified independent (Device/Increase time) Left Sidelying to Sit: 6: Modified independent (Device/Increase time) Supine to Sit: 6: Modified independent (Device/Increase time) Sitting - Scoot to Edge of Bed: 6: Modified independent (Device/Increase time) Transfers Transfers: Sit to Stand;Stand to Sit Sit to Stand: 5:  Supervision Stand to Sit: 5: Supervision    Exercises  Balance     End of Session OT - End of Session Activity Tolerance: Patient tolerated treatment well Patient left: in chair;with call bell/phone within reach Nurse Communication: Mobility status;Precautions  GO     Harolyn Rutherford 10/01/2013, 2:24 PM Pager: [phone]

## 2013-10-01 NOTE — Progress Notes (Signed)
Physical Therapy Treatment Patient Details Name: Tanya Patton MRN: [id number] DOB: Nov 15, 1955 Today's Date: 10/01/2013 Time: 9147-8295 PT Time Calculation (min): 26 min  PT Assessment / Plan / Recommendation  History of Present Illness Patient is a 57 yo female s/p POSTERIOR LUMBAR INTERBODY FUSION LUMBAR FOUR/FIVE LUMBAR FIVE/ SACRAL ONE .   PT Comments   Patient is progressing well, increased ambulation today without difficulty. Good stability and compliance with precautions. Educated patient on positioning for side lying in bed. Will see tomorrow am to address stair negotiation and car transfer.   Follow Up Recommendations  Home health PT           Equipment Recommendations  Rolling walker with 5" wheels;3in1 (PT)    Recommendations for Other Services    Frequency Min 5X/week   Progress towards PT Goals Progress towards PT goals: Progressing toward goals  Plan Current plan remains appropriate    Precautions / Restrictions Precautions Precautions: Back Precaution Comments: reviewed with 3/3 recall Required Braces or Orthoses: Spinal Brace Spinal Brace: Lumbar corset;Applied in sitting position   Pertinent Vitals/Pain 3/10     Mobility  Bed Mobility Bed Mobility: Supine to Sit;Sitting - Scoot to [place] of Bed Rolling Left: 6: Modified independent (Device/Increase time) Left Sidelying to Sit: 6: Modified independent (Device/Increase time) Supine to Sit: 6: Modified independent (Device/Increase time) Sitting - Scoot to Edge of Bed: 6: Modified independent (Device/Increase time) Transfers Transfers: Sit to Stand;Stand to Sit Sit to Stand: 5: Supervision Stand to Sit: 5: Supervision Details for Transfer Assistance: good compliance with precautions Ambulation/Gait Ambulation/Gait Assistance: 5: Supervision Ambulation Distance (Feet): 620 Feet Assistive device: Rolling walker Ambulation/Gait Assistance Details: improved stability Gait Pattern: Step-through  pattern Gait velocity: improved General Gait Details: steady with ambulation      PT Goals (current goals can now be found in the care plan section) Acute Rehab PT Goals Patient Stated Goal: not stated  Visit Information  Last PT Received On: 10/01/13 Assistance Needed: +1 History of Present Illness: Patient is a 57 yo female s/p POSTERIOR LUMBAR INTERBODY FUSION LUMBAR FOUR/FIVE LUMBAR FIVE/ SACRAL ONE .    Subjective Data  Subjective: i feel better today Patient Stated Goal: not stated   Cognition  Cognition Arousal/Alertness: Awake/alert Behavior During Therapy: WFL for tasks assessed/performed Overall Cognitive Status: Within Functional Limits for tasks assessed    Balance  Static Sitting Balance Static Sitting - Balance Support: Feet supported Static Sitting - Level of Assistance: 7: Independent High Level Balance High Level Balance Activites: Side stepping;Backward walking;Direction changes;Turns High Level Balance Comments: supervision  End of Session PT - End of Session Equipment Utilized During Treatment: Gait belt;Back brace Activity Tolerance: Patient tolerated treatment well Patient left: in bed;with call bell/phone within reach Nurse Communication: Mobility status   GP     Fabio Asa 10/01/2013, 4:01 PM Charlotte Crumb, PT DPT  [phone]

## 2013-10-01 NOTE — Progress Notes (Signed)
Patient ID: Tanya Patton, female   DOB: 16-Feb-1956, 57 y.o.   MRN: [id number] Subjective: Patient reports doing well  Objective: Vital signs in last 24 hours: Temp:  [97.8 F (36.6 C)-98.9 F (37.2 C)] 98.4 F (36.9 C) (12/04 0700) Pulse Rate:  [73-102] 78 (12/04 0700) Resp:  [18-19] 19 (12/04 0700) BP: (94-108)/(52-64) 94/52 mmHg (12/04 0700) SpO2:  [95 %-100 %] 95 % (12/04 0700)  Intake/Output from previous day: 12/03 0701 - 12/04 0700 In: 998.7 [P.O.:240; I.V.:758.7] Out: 845 [Urine:600; Drains:245] Intake/Output this shift:    Wound:clean and dry; no new neuro issues   Lab Results: No results found for this basename: WBC, HGB, HCT, PLT,  in the last 72 hours BMET No results found for this basename: NA, K, CL, CO2, GLUCOSE, BUN, CREATININE, CALCIUM,  in the last 72 hours  Studies/Results: Dg Lumbar Spine 2-3 Views  09/29/2013   CLINICAL DATA:  Low back pain  EXAM: LUMBAR SPINE - 2-3 VIEW; DG C-ARM GT 120 MIN  COMPARISON:  MRI 05/10/2013  FINDINGS: C-arm films document L4 through S1 posterolateral and interbody fusion using pedicle screws and interbody cages.  IMPRESSION: Improved spondylolisthesis L4-5.   Electronically Signed   By: Davonna Belling M.D.   On: 09/29/2013 14:31   Dg C-arm Gt 120 Min  09/29/2013   CLINICAL DATA:  Low back pain  EXAM: LUMBAR SPINE - 2-3 VIEW; DG C-ARM GT 120 MIN  COMPARISON:  MRI 05/10/2013  FINDINGS: C-arm films document L4 through S1 posterolateral and interbody fusion using pedicle screws and interbody cages.  IMPRESSION: Improved spondylolisthesis L4-5.   Electronically Signed   By: Davonna Belling M.D.   On: 09/29/2013 14:31    Assessment/Plan: Doing well. Increasing activity. Will plan d/c tomorrow.BP 100/50, but no symptoms from it.  LOS: 2 days  as above   Reinaldo Meeker, MD 10/01/2013, 9:02 AM

## 2013-10-02 MED ORDER — OXYCODONE-ACETAMINOPHEN 5-325 MG PO TABS: 1.0000 | ORAL_TABLET | ORAL | Status: DC | PRN

## 2013-10-02 MED FILL — Sodium Chloride IV Soln 0.9%: INTRAVENOUS | Qty: 1000 | Status: AC

## 2013-10-02 MED FILL — Heparin Sodium (Porcine) Inj 1000 Unit/ML: INTRAMUSCULAR | Qty: 30 | Status: AC

## 2013-10-02 NOTE — Progress Notes (Signed)
Physical Therapy Treatment Patient Details Name: Tanya Patton MRN: [id number] DOB: [birth year] Today's Date: 10/02/2013 Time: 4098-1191 PT Time Calculation (min): 33 min  PT Assessment / Plan / Recommendation  History of Present Illness Patient is a 57 yo female s/p POSTERIOR LUMBAR INTERBODY FUSION LUMBAR FOUR/FIVE LUMBAR FIVE/ SACRAL ONE .   PT Comments   Patient performed increased ambulation, stair negotiation, as well as simulated car transfer today without any difficulty. Patient educated on mobility expectations for home. At this time, feel patient is safe for discharge home with HHPT to follow. Patient in agreement.   Follow Up Recommendations  Home health PT           Equipment Recommendations  Rolling walker with 5" wheels;3in1 (PT)       Frequency Min 5X/week   Progress towards PT Goals Progress towards PT goals: Progressing toward goals  Plan Current plan remains appropriate    Precautions / Restrictions Precautions Precautions: Back Precaution Comments: reviewed with 3/3 recall Required Braces or Orthoses: Spinal Brace Spinal Brace: Lumbar corset;Applied in sitting position   Pertinent Vitals/Pain 5/10 prior to activity    Mobility  Bed Mobility Bed Mobility: Supine to Sit;Sitting - Scoot to [place] of Bed Rolling Left: 6: Modified independent (Device/Increase time) Left Sidelying to Sit: 6: Modified independent (Device/Increase time) Supine to Sit: 6: Modified independent (Device/Increase time) Sitting - Scoot to Edge of Bed: 6: Modified independent (Device/Increase time) Transfers Transfers: Sit to Stand;Stand to Sit Sit to Stand: 6: Modified independent (Device/Increase time) Stand to Sit: 6: Modified independent (Device/Increase time) Details for Transfer Assistance: good compliance with precautions Ambulation/Gait Ambulation/Gait Assistance: 5: Supervision Ambulation Distance (Feet): 600 Feet Assistive device: Rolling walker Gait Pattern:  Step-through pattern Gait velocity: improved General Gait Details: steady with ambulation Stairs: Yes Stairs Assistance: 5: Supervision Stairs Assistance Details (indicate cue type and reason): increased time to perform, VCs for sequencing Stair Management Technique: Two rails;Step to pattern;Forwards Number of Stairs: 4      PT Goals (current goals can now be found in the care plan section) Acute Rehab PT Goals Patient Stated Goal: not stated  Visit Information  Last PT Received On: 10/02/13 Assistance Needed: +1 History of Present Illness: Patient is a 57 yo female s/p POSTERIOR LUMBAR INTERBODY FUSION LUMBAR FOUR/FIVE LUMBAR FIVE/ SACRAL ONE .    Subjective Data  Subjective: i feel better today Patient Stated Goal: not stated   Cognition  Cognition Arousal/Alertness: Awake/alert Behavior During Therapy: WFL for tasks assessed/performed Overall Cognitive Status: Within Functional Limits for tasks assessed    Balance  Static Sitting Balance Static Sitting - Balance Support: Feet supported Static Sitting - Level of Assistance: 7: Independent High Level Balance High Level Balance Activites: Side stepping;Backward walking;Direction changes;Turns High Level Balance Comments: supervision  End of Session PT - End of Session Equipment Utilized During Treatment: Gait belt;Back brace Activity Tolerance: Patient tolerated treatment well Patient left: in bed;with call bell/phone within reach Nurse Communication: Mobility status   GP     Fabio Asa 10/02/2013, 9:19 AM Charlotte Crumb, PT DPT  [phone]

## 2013-10-02 NOTE — Discharge Summary (Signed)
  Physician Discharge Summary  Patient ID: Tanya Patton MRN: [id number] DOB/AGE: 02-03-1956 57 y.o.  Admit date: 09/29/2013 Discharge date: 10/02/2013  Admission Diagnoses:  Discharge Diagnoses:  Active Problems:   Spinal stenosis of lumbar region at multiple levels   Discharged Condition: good  Hospital Course: Surgery with 2 level lumbar fusion 3 days ago.Did very well. Marked improvement in pain. Woundf healing well. By pod 3, ambulating well. Wound doing well. Home with specific instructions given.  Consults: None  Significant Diagnostic Studies: none  Treatments: L45 and L5S1 plif  Discharge Exam: Blood pressure 105/66, pulse 90, temperature 98.7 F (37.1 C), temperature source Oral, resp. rate 20, height 5' (1.524 m), weight 86.955 kg (191 lb 11.2 oz), SpO2 100.00%. Incision/Wound:clean a ndy dry; no new neuro issues  Disposition: 01-Home or Self Care     Medication List    ASK your doctor about these medications       ALIGN 4 MG Caps  Take 1 capsule by mouth daily.     aspirin EC 81 MG tablet  Take 81 mg by mouth daily.     HYDROcodone-acetaminophen 5-325 MG per tablet  Commonly known as:  NORCO/VICODIN  Take 1 tablet by mouth every 4 (four) hours.     lisinopril-hydrochlorothiazide 10-12.5 MG per tablet  Commonly known as:  PRINZIDE,ZESTORETIC  Take 1 tablet by mouth daily.         At home rest most of the time. Get up 9 or 10 times each day and take a 15 or 20 minute walk. No riding in the car and to your first postoperative appointment. If you have neck surgery you may shower from the chest down starting on the third postoperative day. If you had back surgery he may start showering on the third postoperative day with saran wrap wrapped around your incisional area 3 times. After the shower remove the saran wrap. Take pain medicine as needed and other medications as instructed. Call my office for an appointment.  SignedReinaldo Meeker,  MD 10/02/2013, 2:52 PM

## 2013-10-02 NOTE — Progress Notes (Signed)
Darian with [facility] called for delivery of DME/ for possible discharge home todayAlexis Goodell [phone]

## 2013-10-29 HISTORY — PX: BREAST BIOPSY: SHX20

## 2014-08-17 ENCOUNTER — Other Ambulatory Visit: Payer: Self-pay

## 2014-08-17 DIAGNOSIS — Z1231 Encounter for screening mammogram for malignant neoplasm of breast: Secondary | ICD-10-CM

## 2014-09-27 ENCOUNTER — Ambulatory Visit
Admission: RE | Admit: 2014-09-27 | Discharge: 2014-09-27 | Disposition: A | Discharge status: Home / Self Care | Payer: BC Managed Care – PPO | Source: Ambulatory Visit

## 2014-09-27 DIAGNOSIS — Z1231 Encounter for screening mammogram for malignant neoplasm of breast: Secondary | ICD-10-CM

## 2014-09-29 ENCOUNTER — Other Ambulatory Visit: Payer: Self-pay

## 2014-09-29 ENCOUNTER — Other Ambulatory Visit: Payer: Self-pay | Admitting: *Deleted

## 2014-09-29 DIAGNOSIS — R928 Other abnormal and inconclusive findings on diagnostic imaging of breast: Secondary | ICD-10-CM

## 2014-10-13 ENCOUNTER — Ambulatory Visit
Admission: RE | Admit: 2014-10-13 | Discharge: 2014-10-13 | Disposition: A | Discharge status: Home / Self Care | Payer: BC Managed Care – PPO | Source: Ambulatory Visit | Attending: *Deleted | Admitting: *Deleted

## 2014-10-13 ENCOUNTER — Other Ambulatory Visit: Payer: Self-pay | Admitting: *Deleted

## 2014-10-13 DIAGNOSIS — R928 Other abnormal and inconclusive findings on diagnostic imaging of breast: Secondary | ICD-10-CM

## 2014-10-13 DIAGNOSIS — R921 Mammographic calcification found on diagnostic imaging of breast: Secondary | ICD-10-CM

## 2014-10-28 ENCOUNTER — Ambulatory Visit
Admission: RE | Admit: 2014-10-28 | Discharge: 2014-10-28 | Disposition: A | Discharge status: Home / Self Care | Payer: BC Managed Care – PPO | Source: Ambulatory Visit | Attending: *Deleted | Admitting: *Deleted

## 2014-10-28 DIAGNOSIS — R921 Mammographic calcification found on diagnostic imaging of breast: Secondary | ICD-10-CM

## 2015-09-26 ENCOUNTER — Other Ambulatory Visit: Payer: Self-pay

## 2015-09-26 DIAGNOSIS — Z1231 Encounter for screening mammogram for malignant neoplasm of breast: Secondary | ICD-10-CM

## 2015-10-13 ENCOUNTER — Ambulatory Visit
Admission: RE | Admit: 2015-10-13 | Discharge: 2015-10-13 | Disposition: A | Discharge status: Home / Self Care | Payer: BLUE CROSS/BLUE SHIELD | Source: Ambulatory Visit

## 2015-10-13 DIAGNOSIS — Z1231 Encounter for screening mammogram for malignant neoplasm of breast: Secondary | ICD-10-CM

## 2015-11-03 ENCOUNTER — Emergency Department (HOSPITAL_COMMUNITY)
Admission: EM | Admit: 2015-11-03 | Discharge: 2015-11-03 | Disposition: A | Discharge status: Home / Self Care | Payer: BLUE CROSS/BLUE SHIELD | Attending: Emergency Medicine | Admitting: Emergency Medicine

## 2015-11-03 ENCOUNTER — Encounter (HOSPITAL_COMMUNITY): Payer: Self-pay | Admitting: Emergency Medicine

## 2015-11-03 DIAGNOSIS — M79652 Pain in left thigh: Secondary | ICD-10-CM | POA: Insufficient documentation

## 2015-11-03 DIAGNOSIS — I10 Essential (primary) hypertension: Secondary | ICD-10-CM | POA: Diagnosis not present

## 2015-11-03 DIAGNOSIS — G8929 Other chronic pain: Secondary | ICD-10-CM | POA: Diagnosis not present

## 2015-11-03 DIAGNOSIS — Z87891 Personal history of nicotine dependence: Secondary | ICD-10-CM | POA: Insufficient documentation

## 2015-11-03 DIAGNOSIS — Z79899 Other long term (current) drug therapy: Secondary | ICD-10-CM | POA: Diagnosis not present

## 2015-11-03 MED ORDER — ACETAMINOPHEN 500 MG PO TABS: 500.0000 mg | ORAL_TABLET | Freq: Four times a day (QID) | ORAL | Status: DC | PRN

## 2015-11-03 MED ORDER — ACETAMINOPHEN 325 MG PO TABS: 650.0000 mg | ORAL_TABLET | Freq: Once | ORAL | Status: DC

## 2015-11-03 NOTE — ED Notes (Signed)
Per pt, states posterior upper leg/thigh pain for about a week-RN was unable to palpate mass/knot-no SOB

## 2015-11-03 NOTE — Discharge Instructions (Signed)
Pain Without a Known Cause WHAT IS PAIN WITHOUT A KNOWN CAUSE? Pain can occur in any part of the body and can range from mild to severe. Sometimes no cause can be found for why you are having pain. Some types of pain that can occur without a known cause include:   Headache.  Back pain.  Abdominal pain.  Neck pain. HOW IS PAIN WITHOUT A KNOWN CAUSE DIAGNOSED?  Your health care provider will try to find the cause of your pain. This may include:  Physical exam.  Medical history.  Blood tests.  Urine tests.  X-rays. If no cause is found, your health care provider may diagnose you with pain without a known cause.  IS THERE TREATMENT FOR PAIN WITHOUT A CAUSE?  Treatment depends on the kind of pain you have. Your health care provider may prescribe medicines to help relieve your pain.  WHAT CAN I DO AT HOME FOR MY PAIN?   Take medicines only as directed by your health care provider.  Stop any activities that cause pain. During periods of severe pain, bed rest may help.  Try to reduce your stress with activities such as yoga or meditation. Talk to your health care provider for other stress-reducing activity recommendations.  Exercise regularly, if approved by your health care provider.  Eat a healthy diet that includes fruits and vegetables. This may improve pain. Talk to your health care provider if you have any questions about your diet. WHAT IF MY PAIN DOES NOT GET BETTER?  If you have a painful condition and no reason can be found for the pain or the pain gets worse, it is important to follow up with your health care provider. It may be necessary to repeat tests and look further for a possible cause.    This information is not intended to replace advice given to you by your health care provider. Make sure you discuss any questions you have with your health care provider.   Document Released: 07/10/2001 Document Revised: 11/05/2014 Document Reviewed: 03/02/2014 Elsevier  Interactive Patient Education 2016 Else[employer]  Musculoskeletal Pain Musculoskeletal pain is muscle and boney aches and pains. These pains can occur in any part of the body. Your caregiver may treat you without knowing the cause of the pain. They may treat you if blood or urine tests, X-rays, and other tests were normal.  CAUSES There is often not a definite cause or reason for these pains. These pains may be caused by a type of germ (virus). The discomfort may also come from overuse. Overuse includes working out too hard when your body is not fit. Boney aches also come from weather changes. Bone is sensitive to atmospheric pressure changes. HOME CARE INSTRUCTIONS   Ask when your test results will be ready. Make sure you get your test results.  Only take over-the-counter or prescription medicines for pain, discomfort, or fever as directed by your caregiver. If you were given medications for your condition, do not drive, operate machinery or power tools, or sign legal documents for 24 hours. Do not drink alcohol. Do not take sleeping pills or other medications that may interfere with treatment.  Continue all activities unless the activities cause more pain. When the pain lessens, slowly resume normal activities. Gradually increase the intensity and duration of the activities or exercise.  During periods of severe pain, bed rest may be helpful. Lay or sit in any position that is comfortable.  Putting ice on the injured area.  Put ice  in a bag.  Place a towel between your skin and the bag.  Leave the ice on for 15 to 20 minutes, 3 to 4 times a day.  Follow up with your caregiver for continued problems and no reason can be found for the pain. If the pain becomes worse or does not go away, it may be necessary to repeat tests or do additional testing. Your caregiver may need to look further for a possible cause. SEEK IMMEDIATE MEDICAL CARE IF:  You have pain that is getting worse and is not  relieved by medications.  You develop chest pain that is associated with shortness or breath, sweating, feeling sick to your stomach (nauseous), or throw up (vomit).  Your pain becomes localized to the abdomen.  You develop any new symptoms that seem different or that concern you. MAKE SURE YOU:   Understand these instructions.  Will watch your condition.  Will get help right away if you are not doing well or get worse.   This information is not intended to replace advice given to you by your health care provider. Make sure you discuss any questions you have with your health care provider.   Document Released: 10/15/2005 Document Revised: 01/07/2012 Document Reviewed: 06/19/2013 Elsevier Interactive Patient Education 2016 [place] therapy can help ease sore, stiff, injured, and tight muscles and joints. Heat relaxes your muscles, which may help ease your pain.  RISKS AND COMPLICATIONS If you have any of the following conditions, do not use heat therapy unless your health care provider has approved:  Poor circulation.  Healing wounds or scarred skin in the area being treated.  Diabetes, heart disease, or high blood pressure.  Not being able to feel (numbness) the area being treated.  Unusual swelling of the area being treated.  Active infections.  Blood clots.  Cancer.  Inability to communicate pain. This may include young children and people who have problems with their brain function (dementia).  Pregnancy. Heat therapy should only be used on old, pre-existing, or long-lasting (chronic) injuries. Do not use heat therapy on new injuries unless directed by your health care provider. HOW TO USE HEAT THERAPY There are several different kinds of heat therapy, including:  Moist heat pack.  Warm water bath.  Hot water bottle.  Electric heating pad.  Heated gel pack.  Heated wrap.  Electric heating pad. Use the heat therapy method  suggested by your health care provider. Follow your health care provider's instructions on when and how to use heat therapy. GENERAL HEAT THERAPY RECOMMENDATIONS  Do not sleep while using heat therapy. Only use heat therapy while you are awake.  Your skin may turn pink while using heat therapy. Do not use heat therapy if your skin turns red.  Do not use heat therapy if you have new pain.  High heat or long exposure to heat can cause burns. Be careful when using heat therapy to avoid burning your skin.  Do not use heat therapy on areas of your skin that are already irritated, such as with a rash or sunburn. SEEK MEDICAL CARE IF:  You have blisters, redness, swelling, or numbness.  You have new pain.  Your pain is worse. MAKE SURE YOU:  Understand these instructions.  Will watch your condition.  Will get help right away if you are not doing well or get worse.   This information is not intended to replace advice given to you by your health care provider. Make sure  you discuss any questions you have with your health care provider.   Document Released: 01/07/2012 Document Revised: 11/05/2014 Document Reviewed: 12/08/2013 Elsevier Interactive Patient Education [employer].
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
## 2016-08-29 ENCOUNTER — Other Ambulatory Visit: Payer: Self-pay | Admitting: Neurosurgery

## 2016-08-29 DIAGNOSIS — M5416 Radiculopathy, lumbar region: Secondary | ICD-10-CM

## 2016-09-07 ENCOUNTER — Ambulatory Visit
Admission: RE | Admit: 2016-09-07 | Discharge: 2016-09-07 | Disposition: A | Discharge status: Home / Self Care | Payer: BLUE CROSS/BLUE SHIELD | Source: Ambulatory Visit | Attending: Neurosurgery | Admitting: Neurosurgery

## 2016-09-07 VITALS — BP 106/89 | HR 83

## 2016-09-07 DIAGNOSIS — M5416 Radiculopathy, lumbar region: Secondary | ICD-10-CM

## 2016-09-07 DIAGNOSIS — M48061 Spinal stenosis, lumbar region without neurogenic claudication: Secondary | ICD-10-CM

## 2016-09-07 MED ORDER — ONDANSETRON HCL 4 MG/2ML IJ SOLN: 4.0000 mg | Freq: Four times a day (QID) | INTRAMUSCULAR | Status: DC | PRN

## 2016-09-07 MED ORDER — IOPAMIDOL (ISOVUE-M 200) INJECTION 41%
15.0000 mL | Freq: Once | INTRAMUSCULAR | Status: AC
  Administered 2016-09-07: 15 mL via INTRATHECAL

## 2016-09-07 MED ORDER — DIAZEPAM 5 MG PO TABS
5.0000 mg | ORAL_TABLET | Freq: Once | ORAL | Status: AC
  Administered 2016-09-07: 5 mg via ORAL

## 2016-09-07 MED ORDER — MEPERIDINE HCL 100 MG/ML IJ SOLN
75.0000 mg | Freq: Once | INTRAMUSCULAR | Status: AC
  Administered 2016-09-07: 75 mg via INTRAMUSCULAR

## 2016-09-07 MED ORDER — ONDANSETRON HCL 4 MG/2ML IJ SOLN
4.0000 mg | Freq: Once | INTRAMUSCULAR | Status: AC
  Administered 2016-09-07: 4 mg via INTRAMUSCULAR

## 2016-09-07 NOTE — Discharge Instructions (Signed)

## 2016-09-10 ENCOUNTER — Telehealth: Payer: Self-pay

## 2016-09-10 NOTE — Telephone Encounter (Signed)
Spoke with patient after she had a myelogram here 09/07/16.  She states she is doing well and did not get a headache.  jkl

## 2016-09-25 ENCOUNTER — Other Ambulatory Visit: Payer: Self-pay | Admitting: *Deleted

## 2016-09-25 DIAGNOSIS — Z1231 Encounter for screening mammogram for malignant neoplasm of breast: Secondary | ICD-10-CM

## 2016-10-24 ENCOUNTER — Ambulatory Visit
Admission: RE | Admit: 2016-10-24 | Discharge: 2016-10-24 | Disposition: A | Discharge status: Home / Self Care | Payer: BLUE CROSS/BLUE SHIELD | Source: Ambulatory Visit | Attending: *Deleted | Admitting: *Deleted

## 2016-10-24 DIAGNOSIS — Z1231 Encounter for screening mammogram for malignant neoplasm of breast: Secondary | ICD-10-CM

## 2017-09-23 ENCOUNTER — Other Ambulatory Visit: Payer: Self-pay | Admitting: *Deleted

## 2017-09-23 DIAGNOSIS — Z1231 Encounter for screening mammogram for malignant neoplasm of breast: Secondary | ICD-10-CM

## 2017-10-25 ENCOUNTER — Ambulatory Visit
Admission: RE | Admit: 2017-10-25 | Discharge: 2017-10-25 | Disposition: A | Discharge status: Home / Self Care | Payer: BLUE CROSS/BLUE SHIELD | Source: Ambulatory Visit | Attending: *Deleted | Admitting: *Deleted

## 2017-10-25 DIAGNOSIS — Z1231 Encounter for screening mammogram for malignant neoplasm of breast: Secondary | ICD-10-CM

## 2018-07-30 ENCOUNTER — Other Ambulatory Visit: Payer: Self-pay | Admitting: Family Medicine

## 2018-07-30 DIAGNOSIS — Z1231 Encounter for screening mammogram for malignant neoplasm of breast: Secondary | ICD-10-CM

## 2018-07-30 DIAGNOSIS — Z780 Asymptomatic menopausal state: Secondary | ICD-10-CM

## 2018-10-27 ENCOUNTER — Ambulatory Visit
Admission: RE | Admit: 2018-10-27 | Discharge: 2018-10-27 | Disposition: A | Discharge status: Home / Self Care | Payer: BLUE CROSS/BLUE SHIELD | Source: Ambulatory Visit | Attending: Family Medicine | Admitting: Family Medicine

## 2018-10-27 DIAGNOSIS — Z1231 Encounter for screening mammogram for malignant neoplasm of breast: Secondary | ICD-10-CM

## 2018-10-27 DIAGNOSIS — Z780 Asymptomatic menopausal state: Secondary | ICD-10-CM

## 2019-10-12 ENCOUNTER — Other Ambulatory Visit: Payer: Self-pay | Admitting: Obstetrics and Gynecology

## 2019-10-12 DIAGNOSIS — Z1231 Encounter for screening mammogram for malignant neoplasm of breast: Secondary | ICD-10-CM

## 2019-11-27 ENCOUNTER — Ambulatory Visit
Admission: RE | Admit: 2019-11-27 | Discharge: 2019-11-27 | Disposition: A | Discharge status: Home / Self Care | Payer: BLUE CROSS/BLUE SHIELD | Source: Ambulatory Visit | Attending: Obstetrics and Gynecology | Admitting: Obstetrics and Gynecology

## 2019-11-27 ENCOUNTER — Other Ambulatory Visit: Payer: Self-pay

## 2019-11-27 DIAGNOSIS — Z1231 Encounter for screening mammogram for malignant neoplasm of breast: Secondary | ICD-10-CM

## 2020-04-02 ENCOUNTER — Other Ambulatory Visit: Payer: Self-pay

## 2020-04-02 ENCOUNTER — Encounter (HOSPITAL_COMMUNITY): Payer: Self-pay

## 2020-04-02 ENCOUNTER — Ambulatory Visit (HOSPITAL_COMMUNITY)
Admission: EM | Admit: 2020-04-02 | Discharge: 2020-04-02 | Disposition: A | Discharge status: Home / Self Care | Payer: 59 | Attending: Family Medicine | Admitting: Family Medicine

## 2020-04-02 DIAGNOSIS — S51812A Laceration without foreign body of left forearm, initial encounter: Secondary | ICD-10-CM

## 2020-04-02 HISTORY — DX: Other seasonal allergic rhinitis: J30.2

## 2020-04-02 NOTE — ED Triage Notes (Addendum)
Pt got a laceration on the anterior of left arm with a box cutter today. Pt has about 2 in lac on lower anterior forearm. Bleeding is controlled
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
## 2020-04-13 ENCOUNTER — Ambulatory Visit (HOSPITAL_COMMUNITY)
Admission: EM | Admit: 2020-04-13 | Discharge: 2020-04-13 | Disposition: A | Discharge status: Home / Self Care | Payer: 59 | Attending: Urgent Care | Admitting: Urgent Care

## 2020-04-13 ENCOUNTER — Other Ambulatory Visit: Payer: Self-pay

## 2020-04-13 DIAGNOSIS — S51812D Laceration without foreign body of left forearm, subsequent encounter: Secondary | ICD-10-CM

## 2020-09-25 ENCOUNTER — Encounter (HOSPITAL_COMMUNITY): Payer: Self-pay | Admitting: Family Medicine

## 2020-09-25 ENCOUNTER — Other Ambulatory Visit: Payer: Self-pay

## 2020-09-25 ENCOUNTER — Encounter (HOSPITAL_COMMUNITY): Payer: Self-pay | Admitting: Emergency Medicine

## 2020-09-25 ENCOUNTER — Emergency Department (HOSPITAL_COMMUNITY): Payer: 59

## 2020-09-25 ENCOUNTER — Emergency Department (HOSPITAL_COMMUNITY)
Admission: EM | Admit: 2020-09-25 | Discharge: 2020-09-25 | Disposition: A | Discharge status: Home / Self Care | Payer: 59 | Attending: Emergency Medicine | Admitting: Emergency Medicine

## 2020-09-25 ENCOUNTER — Ambulatory Visit (HOSPITAL_COMMUNITY)
Admission: EM | Admit: 2020-09-25 | Discharge: 2020-09-25 | Disposition: A | Discharge status: Home / Self Care | Payer: 59 | Attending: Family Medicine | Admitting: Family Medicine

## 2020-09-25 DIAGNOSIS — G44311 Acute post-traumatic headache, intractable: Secondary | ICD-10-CM

## 2020-09-25 DIAGNOSIS — I10 Essential (primary) hypertension: Secondary | ICD-10-CM | POA: Insufficient documentation

## 2020-09-25 DIAGNOSIS — S060X0A Concussion without loss of consciousness, initial encounter: Secondary | ICD-10-CM | POA: Diagnosis not present

## 2020-09-25 DIAGNOSIS — R42 Dizziness and giddiness: Secondary | ICD-10-CM | POA: Diagnosis not present

## 2020-09-25 DIAGNOSIS — S0990XA Unspecified injury of head, initial encounter: Secondary | ICD-10-CM | POA: Diagnosis not present

## 2020-09-25 DIAGNOSIS — W19XXXA Unspecified fall, initial encounter: Secondary | ICD-10-CM

## 2020-09-25 DIAGNOSIS — F17210 Nicotine dependence, cigarettes, uncomplicated: Secondary | ICD-10-CM | POA: Diagnosis not present

## 2020-09-25 DIAGNOSIS — W07XXXA Fall from chair, initial encounter: Secondary | ICD-10-CM | POA: Insufficient documentation

## 2020-09-25 DIAGNOSIS — Z7984 Long term (current) use of oral hypoglycemic drugs: Secondary | ICD-10-CM | POA: Diagnosis not present

## 2020-09-25 DIAGNOSIS — H538 Other visual disturbances: Secondary | ICD-10-CM

## 2020-09-25 DIAGNOSIS — Z79899 Other long term (current) drug therapy: Secondary | ICD-10-CM | POA: Insufficient documentation

## 2020-09-25 MED ORDER — NITROGLYCERIN IN D5W 200-5 MCG/ML-% IV SOLN: 0.0000 ug/min | INTRAVENOUS | Status: DC

## 2020-09-25 MED ORDER — ONDANSETRON 4 MG PO TBDP: 4.0000 mg | ORAL_TABLET | Freq: Three times a day (TID) | ORAL | 0 refills | Status: DC | PRN

## 2020-09-25 MED ORDER — ASPIRIN 81 MG PO CHEW: 324.0000 mg | CHEWABLE_TABLET | Freq: Once | ORAL | Status: DC

## 2020-09-25 NOTE — ED Provider Notes (Signed)
[facility]    CSN: [id number] Arrival date & time: 09/25/20  1009      History   Chief Complaint Chief Complaint  Patient presents with  . Fall  . Head Injury    HPI Tanya Patton is a 64 y.o. female.   This is a 64 year old retired woman who is been a patient here in the past.  She fell when trying to sit down 5 days ago in the chair was not there.  She hit the back of her head on a concrete floor and has had a headache ever since.  She is also been dizzy with unsteady gait.  She says her vision has been blurry although she denies double vision.  Her hearing is okay.  Patient has difficulty with her left arm and cannot raise it over her head because of a pre-existing condition.  She is taking blood pressure medicine but otherwise, has had spinal stenosis and surgery on the lumbar region.  Patient says that her headache is behind her eyes and in the back of her head.  She said her scalp feels tender.  She also has some neck soreness but is able to turn her head each way.     Past Medical History:  Diagnosis Date  . Complication of anesthesia    woke up fighting  . Hypertension   . Seasonal allergies   . Spinal stenosis     Patient Active Problem List   Diagnosis Date Noted  . Spinal stenosis of lumbar region at multiple levels 09/29/2013  . TOBACCO USER 08/18/2007  . DYSLIPIDEMIA 07/28/2007  . HYPERTENSION 07/28/2007    Past Surgical History:  Procedure Laterality Date  . arm surg Left   . BACK SURGERY    . BREAST BIOPSY Left 2015  . CERVICAL FUSION    . COLONOSCOPY  2011    OB History   No obstetric history on file.      Home Medications    Prior to Admission medications   Medication Sig Start Date End Date Taking? Authorizing Provider  cyclobenzaprine (FLEXERIL) 10 MG tablet Take 10 mg by mouth 3 (three) times daily as needed for muscle spasms.   Yes [provider]  levocetirizine (XYZAL) 5 MG tablet Take 5 mg by mouth  every evening.   Yes [provider]  lisinopril-hydrochlorothiazide (PRINZIDE,ZESTORETIC) 10-12.5 MG per tablet Take 1 tablet by mouth daily.   Yes [provider]  losartan (COZAAR) 25 MG tablet Take 25 mg by mouth daily.   Yes [provider]  acetaminophen (TYLENOL) 500 MG tablet Take 1 tablet (500 mg total) by mouth every 6 (six) hours as needed for mild pain or moderate pain. 11/03/15   [name], PA-C  metFORMIN (GLUCOPHAGE) 500 MG tablet Take by mouth 2 (two) times daily with a meal.    [provider]  montelukast (SINGULAIR) 10 MG tablet Take 10 mg by mouth at bedtime.    [provider]  naproxen (NAPROSYN) 250 MG tablet Take by mouth 2 (two) times daily with a meal.    [provider]  oxyCODONE-acetaminophen (PERCOCET/ROXICET) 5-325 MG per tablet Take 1-2 tablets by mouth every 4 (four) hours as needed for moderate pain. 10/02/13   Karie Chimera, MD  Probiotic Product (ALIGN) 4 MG CAPS Take 1 capsule by mouth daily.    [provider]  rosuvastatin (CRESTOR) 20 MG tablet Take 20 mg by mouth daily.    [provider]  Family History Family History  Problem Relation Age of Onset  . Breast cancer Father     Social History Social History   Tobacco Use  . Smoking status: Current Every Day Smoker    Packs/day: 1.00    Years: 30.00    Pack years: 30.00    Types: Cigarettes    Last attempt to quit: 08/18/2013    Years since quitting: 7.1  . Smokeless tobacco: Never Used  Substance Use Topics  . Alcohol use: No  . Drug use: No     Allergies   Atorvastatin and Shellfish allergy   Review of Systems Review of Systems  Constitutional: Negative.   HENT: Negative.   Eyes: Positive for visual disturbance.  Respiratory: Negative.   Gastrointestinal: Negative.   Musculoskeletal: Positive for neck stiffness.  Neurological: Positive for dizziness and headaches.     Physical Exam Triage Vital  Signs ED Triage Vitals [09/25/20 1035]  Enc Vitals Group     BP 135/86     Pulse      Resp      Temp      Temp src      SpO2      Weight      Height      Head Circumference      Peak Flow      Pain Score      Pain Loc      Pain Edu?      Excl. in [place]?    No data found.  Updated Vital Signs BP 135/86   Pulse 92   Temp 98.5 F (36.9 C) (Oral)   Resp 18   SpO2 99%    Physical Exam Vitals and nursing note reviewed.  Constitutional:      General: She is not in acute distress.    Appearance: Normal appearance. She is obese. She is not ill-appearing.  HENT:     Head: Normocephalic.     Comments: No swelling palpated on occipital area where patient does have some tenderness    Nose: Nose normal.     Mouth/Throat:     Pharynx: Oropharynx is clear.  Eyes:     Extraocular Movements: Extraocular movements intact.     Conjunctiva/sclera: Conjunctivae normal.     Comments: Pupils are pinpoint bilaterally  Cardiovascular:     Rate and Rhythm: Normal rate.     Heart sounds: Normal heart sounds.  Pulmonary:     Effort: Pulmonary effort is normal.     Breath sounds: Normal breath sounds.  Musculoskeletal:     Cervical back: Normal range of motion and neck supple.     Comments: Patient is unable to raise her left arm over her head due to stiffness in the shoulder and elbow.  Skin:    General: Skin is warm and dry.  Neurological:     Mental Status: She is oriented to person, place, and time.     Cranial Nerves: No cranial nerve deficit.     Sensory: No sensory deficit.     Coordination: Coordination abnormal.  Psychiatric:        Mood and Affect: Mood normal.        Behavior: Behavior normal.        Thought Content: Thought content normal.        Judgment: Judgment normal.      UC Treatments / Results  Labs (all labs ordered are listed, but only abnormal results are displayed) Labs Reviewed - No data to  display  EKG   Radiology No results  found.  Procedures Procedures (including critical care time)  Medications Ordered in UC Medications - No data to display  Initial Impression / Assessment and Plan / UC Course  I have reviewed the triage vital signs and the nursing notes.  Pertinent labs & imaging results that were available during my care of the patient were reviewed by me and considered in my medical decision making (see chart for details).   I am having difficulty doing a complete neurological exam because of patient's pre-existing problems.  She does have the dizziness and headache which, given her age and the duration of her symptoms, deserve further evaluation in the emergency room. Final Clinical Impressions(s) / UC Diagnoses   Final diagnoses:  Injury of head, initial encounter  Dizziness and giddiness  Intractable acute post-traumatic headache  Blurry vision, bilateral     Discharge Instructions     I am sending you down to the emergency room for further evaluation to make sure that there has not been any bleeding following your head injury.    ED Prescriptions    None     I have reviewed the PDMP during this encounter.   Robyn Haber, MD 09/25/20 1052

## 2020-09-25 NOTE — ED Provider Notes (Signed)
[facility] EMERGENCY DEPARTMENT Provider Note   CSN: [id number] Arrival date & time: 09/25/20  1127     History Chief Complaint  Patient presents with  . Fall  . Headache    Tanya Patton is a 64 y.o. female with PMHx HTN and spinal stenosis with L4-S1 fusion who presents to the ED today from UC with complaint of mechanical fall that occurred 6 days ago.  She states that she went to sit down in a chair at her dining room table when the chair was pushed back behind her as it was a rolling chair causing her to fall and land directly onto her back and hit her head on concrete flooring.  No loss of consciousness.  Patient is not anticoagulated.  She states that immediately she had pain to her occipital scalp and throughout the days has progressively felt more fatigued with blurry vision 2 days later that has not resolved.  Patient states when she first gets up from a sitting or laying down position she will feel a little bit off balance however this will resolve while she ambulates.  She does report that her back has been bothering her more since the fall and this is caused her to walk a little bit slower as well she went to urgent care today with these concerned that she was sent here to have a CT scan done of her head with concern for possible brain bleed.   The history is provided by the patient and medical records.       Past Medical History:  Diagnosis Date  . Complication of anesthesia    woke up fighting  . Hypertension   . Seasonal allergies   . Spinal stenosis     Patient Active Problem List   Diagnosis Date Noted  . Spinal stenosis of lumbar region at multiple levels 09/29/2013  . TOBACCO USER 08/18/2007  . DYSLIPIDEMIA 07/28/2007  . HYPERTENSION 07/28/2007    Past Surgical History:  Procedure Laterality Date  . arm surg Left   . BACK SURGERY    . BREAST BIOPSY Left 2015  . CERVICAL FUSION    . COLONOSCOPY  2011     OB History   No  obstetric history on file.     Family History  Problem Relation Age of Onset  . Breast cancer Father     Social History   Tobacco Use  . Smoking status: Current Every Day Smoker    Packs/day: 1.00    Years: 30.00    Pack years: 30.00    Types: Cigarettes    Last attempt to quit: 08/18/2013    Years since quitting: 7.1  . Smokeless tobacco: Never Used  Substance Use Topics  . Alcohol use: No  . Drug use: No    Home Medications Prior to Admission medications   Medication Sig Start Date End Date Taking? Authorizing Provider  acetaminophen (TYLENOL) 500 MG tablet Take 1 tablet (500 mg total) by mouth every 6 (six) hours as needed for mild pain or moderate pain. 11/03/15   [name], PA-C  cyclobenzaprine (FLEXERIL) 10 MG tablet Take 10 mg by mouth 3 (three) times daily as needed for muscle spasms.    [provider]  levocetirizine (XYZAL) 5 MG tablet Take 5 mg by mouth every evening.    [provider]  lisinopril-hydrochlorothiazide (PRINZIDE,ZESTORETIC) 10-12.5 MG per tablet Take 1 tablet by mouth daily.    [provider]  losartan (COZAAR) 25 MG  tablet Take 25 mg by mouth daily.    [provider]  metFORMIN (GLUCOPHAGE) 500 MG tablet Take by mouth 2 (two) times daily with a meal.    [provider]  montelukast (SINGULAIR) 10 MG tablet Take 10 mg by mouth at bedtime.    [provider]  naproxen (NAPROSYN) 250 MG tablet Take by mouth 2 (two) times daily with a meal.    [provider]  ondansetron (ZOFRAN ODT) 4 MG disintegrating tablet Take 1 tablet (4 mg total) by mouth every 8 (eight) hours as needed for nausea or vomiting. 09/25/20   Eustaquio Maize, PA-C  oxyCODONE-acetaminophen (PERCOCET/ROXICET) 5-325 MG per tablet Take 1-2 tablets by mouth every 4 (four) hours as needed for moderate pain. 10/02/13   Karie Chimera, MD  Probiotic Product (ALIGN) 4 MG CAPS Take 1 capsule by mouth daily.    [provider]  rosuvastatin (CRESTOR) 20 MG tablet Take 20 mg by mouth daily.    [provider]    Allergies    Atorvastatin and Shellfish allergy  Review of Systems   Review of Systems  Constitutional: Negative for chills and fever.  Eyes: Positive for visual disturbance (resolved).  Gastrointestinal: Negative for nausea and vomiting.  Neurological: Positive for headaches. Negative for syncope.  All other systems reviewed and are negative.   Physical Exam Updated Vital Signs BP (!) 145/94 (BP Location: Left Arm)   Pulse 87   Temp 98.4 F (36.9 C) (Oral)   Resp 18   SpO2 99%   Physical Exam Vitals and nursing note reviewed.  Constitutional:      Appearance: She is obese. She is not ill-appearing or diaphoretic.  HENT:     Head: Normocephalic and atraumatic.  Eyes:     Extraocular Movements: Extraocular movements intact.     Conjunctiva/sclera: Conjunctivae normal.     Pupils: Pupils are equal, round, and reactive to light.  Cardiovascular:     Rate and Rhythm: Normal rate and regular rhythm.  Pulmonary:     Effort: Pulmonary effort is normal.     Breath sounds: Normal breath sounds. No wheezing, rhonchi or rales.  Abdominal:     Palpations: Abdomen is soft.     Tenderness: There is no abdominal tenderness.  Musculoskeletal:     Cervical back: Neck supple.     Comments: Midline lumbar incision with + right sided paralumbar musculature TTP. ROM intact to neck and back. Negative SLR.   Skin:    General: Skin is warm and dry.  Neurological:     Mental Status: She is alert.     Comments: Alert and oriented to self, place, time and event.   Speech is fluent, clear without dysarthria or dysphasia.   Strength 5/5 in RUE and BLEs. Strength 4/5 to LUE; pt with history of cubital tunnel release on left side and she reports she has had decreased strength since then.  Sensation intact in upper/lower extremities   Slow gait which patient attributes to her lower  back pain since the fall.  Negative Romberg. No pronator drift.  Normal finger-to-nose and feet tapping.  CN I not tested  CN II grossly intact visual fields bilaterally. Did not visualize posterior eye.   CN III, IV, VI PERRLA and EOMs intact bilaterally  CN V Intact sensation to sharp and light touch to the face  CN VII facial movements symmetric  CN VIII not tested  CN IX, X no uvula deviation, symmetric rise of soft  palate  CN XI 5/5 SCM and trapezius strength bilaterally  CN XII Midline tongue protrusion, symmetric L/R movements      ED Results / Procedures / Treatments   Labs (all labs ordered are listed, but only abnormal results are displayed) Labs Reviewed - No data to display  EKG None  Radiology CT Head Wo Contrast  Result Date: 09/25/2020 CLINICAL DATA:  Pain following fall EXAM: CT HEAD WITHOUT CONTRAST TECHNIQUE: Contiguous axial images were obtained from the base of the skull through the vertex without intravenous contrast. COMPARISON:  April 08, 2008 FINDINGS: Brain: Ventricles and sulci are normal in size and configuration. There is no intracranial mass, hemorrhage, extra-axial fluid collection, or midline shift. The brain parenchyma appears unremarkable. No evident acute infarct. There is mild CSF extension into the sella, a stable finding of doubtful clinical significance. Vascular: No hyperdense vessel. Foci of calcification noted in each carotid siphon region. Skull: Bony calvarium a appears intact. Sinuses/Orbits: There is opacification in a portion of a right ethmoid air cell. Other visualized paranasal sinuses are clear. Orbits appear symmetric bilaterally. Other: Visualized mastoid air cells are clear. IMPRESSION: Brain parenchyma appears unremarkable.  No mass or hemorrhage. There are foci of arterial vascular calcification. There is slight right ethmoid sinus disease. 3 Electronically Signed   By: Lowella Grip III M.D.   On: 09/25/2020 13:57   CT Lumbar  Spine Wo Contrast  Result Date: 09/25/2020 CLINICAL DATA:  Fall, low back pain. EXAM: CT LUMBAR SPINE WITHOUT CONTRAST TECHNIQUE: Multidetector CT imaging of the lumbar spine was performed without intravenous contrast administration. Multiplanar CT image reconstructions were also generated. COMPARISON:  09/07/2016 FINDINGS: Segmentation: The lowest lumbar type non-rib-bearing vertebra is labeled as L5. Alignment: 2 mm of chronic fused anterolisthesis of L4 on L5. Vertebrae: Solid interbody fusion at L4-5 and L5-S1 with fused facet joints and posterolateral rod and pedicle screw fixation at L4-L5-S1. No lucency around the pedicle screws or hardware complicating feature is identified. No lumbar spine fracture or acute bony findings. Paraspinal and other soft tissues: Aortoiliac atherosclerotic vascular disease. 1.2 by 1.2 cm left adrenal mass is low density compatible with an adenoma. Disc levels: L1-2: Unremarkable. L2-3: No osseous impingement identified. There is suspicion for a disc bulge and possible left foraminal disc protrusion along with suspected left foraminal stenosis related to degenerative disc disease. L3-4: No impingement. Suspected disc bulge along with bilateral facet arthropathy. L4-5: No impingement.  Fused level. L5-S1: No impingement.  Fused level. IMPRESSION: 1. Lumbar spondylosis and degenerative disc disease causing mild left foraminal stenosis at L2-3. 2. Solid interbody fusion at L4-5 and L5-S1 without observed impingement. 3. Small left adrenal adenoma. 4. Aortic atherosclerosis. Aortic Atherosclerosis (ICD10-I70.0). Electronically Signed   By: Van Clines M.D.   On: 09/25/2020 14:00    Procedures Procedures (including critical care time)  Medications Ordered in ED Medications - No data to display  ED Course  I have reviewed the triage vital signs and the nursing notes.  Pertinent labs & imaging results that were available during my care of the patient were reviewed by  me and considered in my medical decision making (see chart for details).    MDM Rules/Calculators/A&P                          64 year old female presents to the ED from urgent care for CT scan of her head secondary to a mechanical fall that occurred 6 days ago.  She  has been having some headaches, blurry vision which is now resolved, and unsteady feeling as she gets up out of a seated or laying down position since then.  No previous history of head injury.  Patient is not anticoagulated.  On arrival to the ED vitals are stable.  She has no focal neuro deficits on exam today.  She does have a history of a cubital tunnel release on the left side and has had decreased strength in this arm since then which she reports is no worse than normal.  She is having some lower back pain as well from the fall, history of L4-S1 spinal fusion.  She states that this is causing her to walk slower than normal due to the pain.  Given she is here for CT scan of her head will obtain a Noncon CT of the L-spine as well to assess for any hardware abnormalities.   CT head negative CT L spine  IMPRESSION:  1. Lumbar spondylosis and degenerative disc disease causing mild  left foraminal stenosis at L2-3.  2. Solid interbody fusion at L4-5 and L5-S1 without observed  impingement.  3. Small left adrenal adenoma.  4. Aortic atherosclerosis.   I have discussed incidentaloma with patient. She will follow up with her PCP.  Have also discussed that her symptoms are likely related to a concussion.  Brain rest discussed with patient.  Will discharge home with Zofran.  Patient is in agreement with plan at this time is stable for discharge home.   This note was prepared using Dragon voice recognition software and may include unintentional dictation errors due to the inherent limitations of voice recognition software.   Final Clinical Impression(s) / ED Diagnoses Final diagnoses:  Fall, initial encounter  Injury of head, initial  encounter  Concussion without loss of consciousness, initial encounter    Rx / DC Orders ED Discharge Orders         Ordered    ondansetron (ZOFRAN ODT) 4 MG disintegrating tablet  Every 8 hours PRN        09/25/20 1557           Discharge Instructions     Please pick up the nausea medication and take as needed  You are likely experiencing symptoms related to concussion. See attached information. It is very important to let your brain rest. This includes sitting in a darkened room and avoiding bright lights  Your CT scan did show an incidental finding of an adrenal gland adenoma. Please follow up with your PCP for same  Return to the ED for any worsening symptoms       Eustaquio Maize, PA-C 09/25/20 1601    Daleen Bo, MD 09/26/20 1046

## 2020-09-25 NOTE — ED Triage Notes (Signed)
Reports sliding off a rolling chair when she went to sit down 5 days ago, hitting back of head on floor.  Denies LOC.  Since then, c/o HAs, "just not feeling right".  Denies n/v.  States had some vision changes 3 days ago which have resolved. A & Ox4.  Dr. Joseph Art notified of pt.

## 2020-09-25 NOTE — Discharge Instructions (Signed)
Please pick up the nausea medication and take as needed  You are likely experiencing symptoms related to concussion. See attached information. It is very important to let your brain rest. This includes sitting in a darkened room and avoiding bright lights  Your CT scan did show an incidental finding of an adrenal gland adenoma. Please follow up with your PCP for same  Return to the ED for any worsening symptoms

## 2020-09-25 NOTE — Discharge Instructions (Addendum)
I am sending you down to the emergency room for further evaluation to make sure that there has not been any bleeding following your head injury.

## 2020-09-25 NOTE — ED Triage Notes (Signed)
Pt sent from [facility].  Reports she went to sit down in a rolling chair on Tuesday and the chair had moved causing her to fall.  Hit back of head on the floor.  Denies LOC.  Denies nausea and vomiting.  No blood thinners.  Has had a headache since hitting head.

## 2021-04-24 ENCOUNTER — Other Ambulatory Visit: Payer: Self-pay | Admitting: Family Medicine

## 2021-04-24 DIAGNOSIS — Z1231 Encounter for screening mammogram for malignant neoplasm of breast: Secondary | ICD-10-CM

## 2021-06-14 ENCOUNTER — Ambulatory Visit
Admission: RE | Admit: 2021-06-14 | Discharge: 2021-06-14 | Disposition: A | Discharge status: Home / Self Care | Payer: Medicare Other | Source: Ambulatory Visit | Attending: Family Medicine | Admitting: Family Medicine

## 2021-06-14 ENCOUNTER — Other Ambulatory Visit: Payer: Self-pay

## 2021-06-14 DIAGNOSIS — Z1231 Encounter for screening mammogram for malignant neoplasm of breast: Secondary | ICD-10-CM | POA: Diagnosis not present

## 2021-07-13 DIAGNOSIS — Z79899 Other long term (current) drug therapy: Secondary | ICD-10-CM | POA: Diagnosis not present

## 2021-07-13 DIAGNOSIS — R7309 Other abnormal glucose: Secondary | ICD-10-CM | POA: Diagnosis not present

## 2021-07-27 DIAGNOSIS — E7849 Other hyperlipidemia: Secondary | ICD-10-CM | POA: Diagnosis not present

## 2021-07-27 DIAGNOSIS — I700 Atherosclerosis of aorta: Secondary | ICD-10-CM | POA: Diagnosis not present

## 2021-07-27 DIAGNOSIS — Z23 Encounter for immunization: Secondary | ICD-10-CM | POA: Diagnosis not present

## 2021-07-27 DIAGNOSIS — Z79899 Other long term (current) drug therapy: Secondary | ICD-10-CM | POA: Diagnosis not present

## 2021-07-27 DIAGNOSIS — E559 Vitamin D deficiency, unspecified: Secondary | ICD-10-CM | POA: Diagnosis not present

## 2021-07-27 DIAGNOSIS — R7309 Other abnormal glucose: Secondary | ICD-10-CM | POA: Diagnosis not present

## 2021-07-27 DIAGNOSIS — F17200 Nicotine dependence, unspecified, uncomplicated: Secondary | ICD-10-CM | POA: Diagnosis not present

## 2021-07-27 DIAGNOSIS — J3089 Other allergic rhinitis: Secondary | ICD-10-CM | POA: Diagnosis not present

## 2021-07-27 DIAGNOSIS — M5136 Other intervertebral disc degeneration, lumbar region: Secondary | ICD-10-CM | POA: Diagnosis not present

## 2021-07-27 DIAGNOSIS — Z0000 Encounter for general adult medical examination without abnormal findings: Secondary | ICD-10-CM | POA: Diagnosis not present

## 2021-07-31 ENCOUNTER — Other Ambulatory Visit: Payer: Self-pay | Admitting: Family Medicine

## 2021-07-31 DIAGNOSIS — F17200 Nicotine dependence, unspecified, uncomplicated: Secondary | ICD-10-CM

## 2021-09-06 DIAGNOSIS — K5900 Constipation, unspecified: Secondary | ICD-10-CM | POA: Diagnosis not present

## 2021-10-22 IMAGING — CT CT HEAD W/O CM
4 series · 16 of 47 positions shown, 18 images · non-contrast
Comparison: April 08, 2008

CLINICAL DATA: Pain following fall

EXAM:
CT HEAD WITHOUT CONTRAST
TECHNIQUE: Contiguous axial images were obtained from the base of the skull
through the vertex without intravenous contrast.

[Series 3: head without · axial · non-contrast · 0.42mm/px · z∈[-206,-90]mm · 7 of 31 slices shown, 9 images]
[im 4/31  brain]
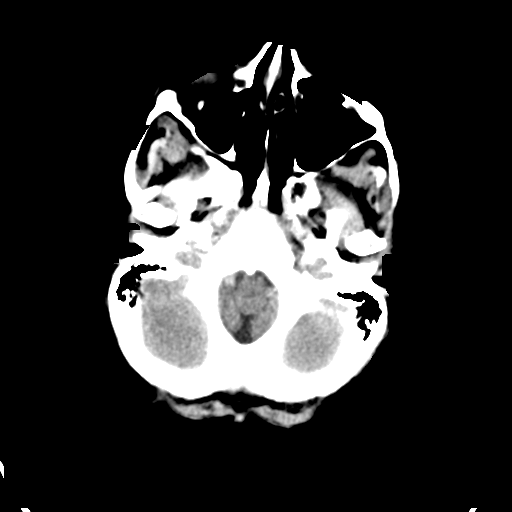
[im 4/31  bone]
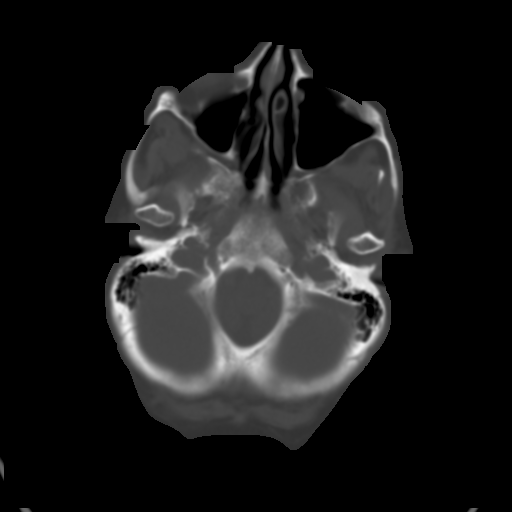
[im 8/31  brain]
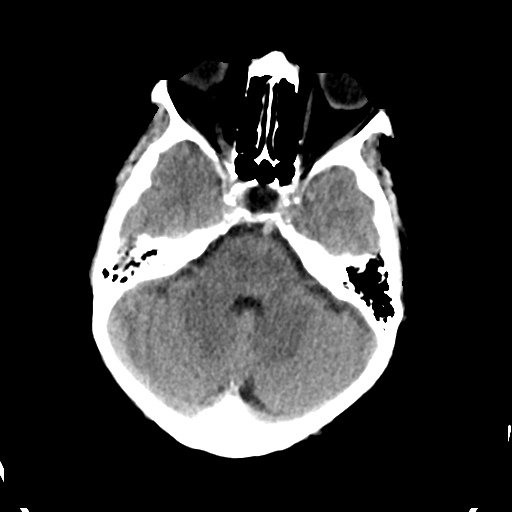
[im 12/31  brain]
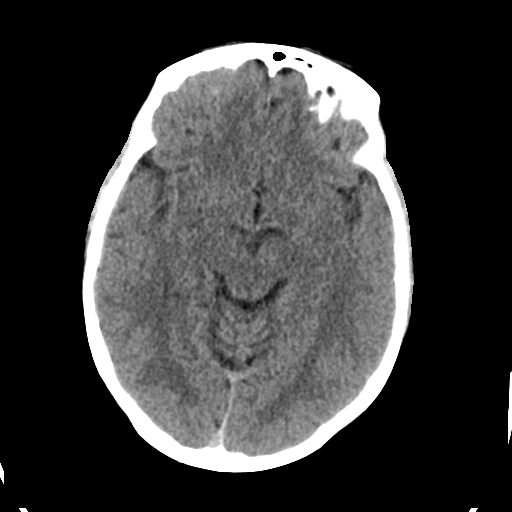
[im 16/31  brain]
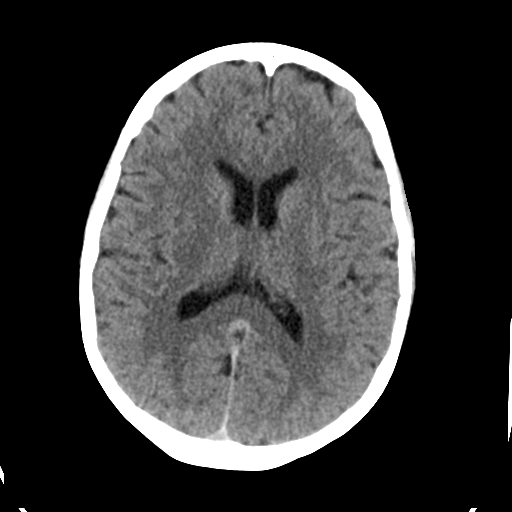
[im 19/31  brain]
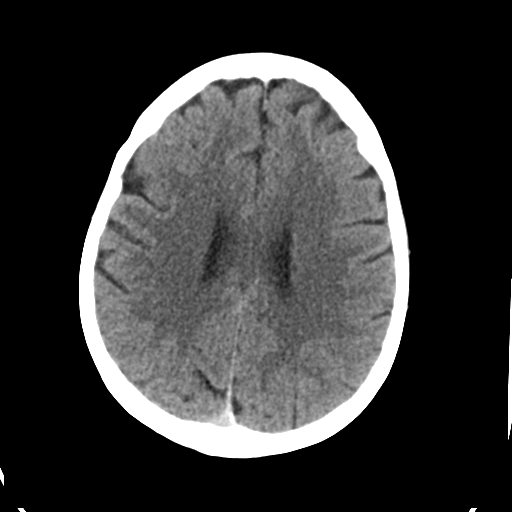
[im 19/31  bone]
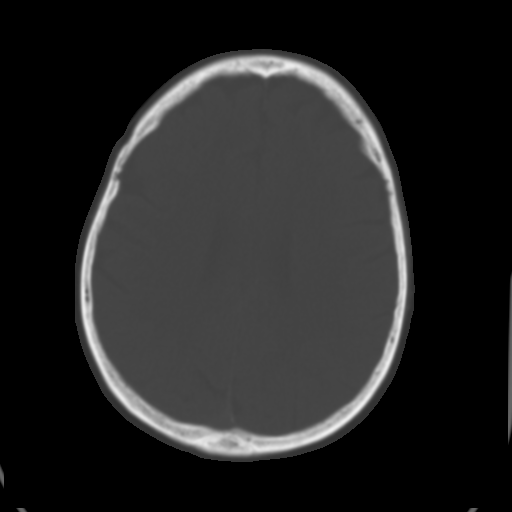
[im 23/31  brain]
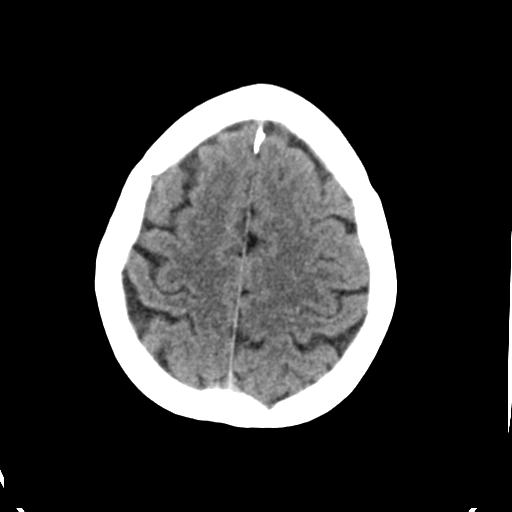
[im 27/31  brain]
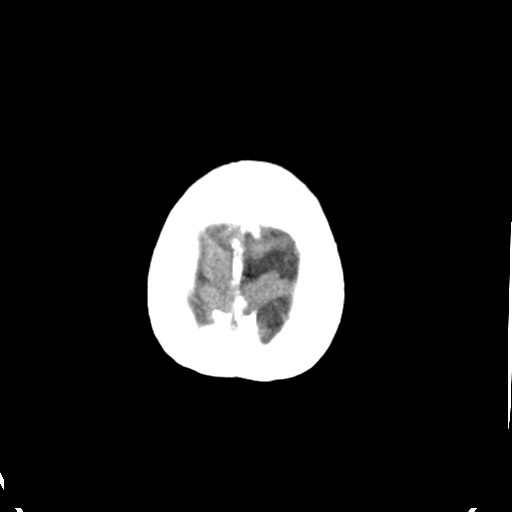

[Series 4: head bone · axial · 0.42mm/px · z∈[-206,-174]mm · 3 of 78 slices shown]
[im 8/78  bone]
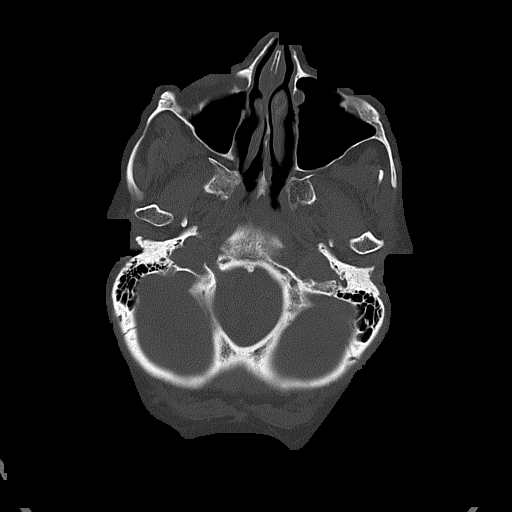
[im 16/78  bone]
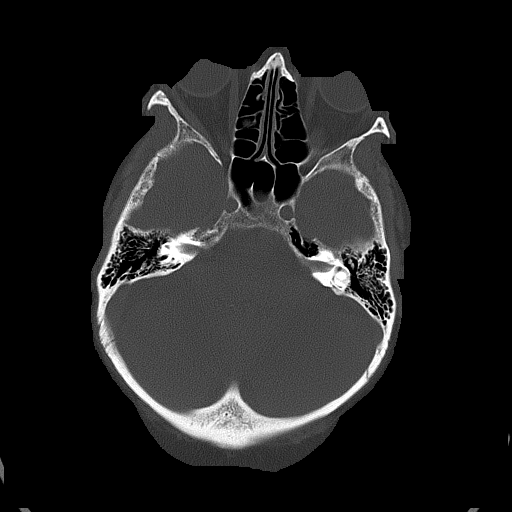
[im 24/78  bone]
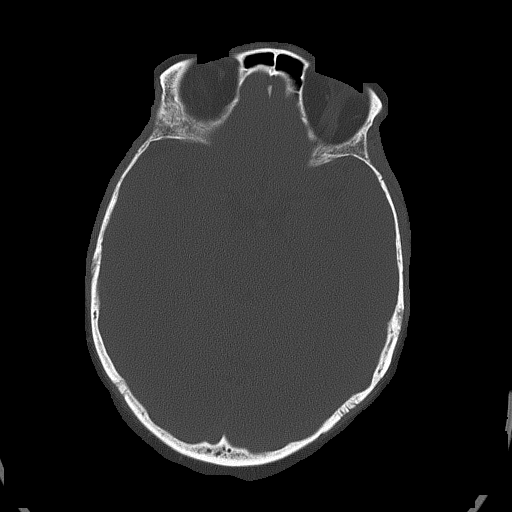

[Series 5: head without cor · coronal · non-contrast · 0.32mm/px · 3 of 63 slices shown]
[im 21/63  brain]
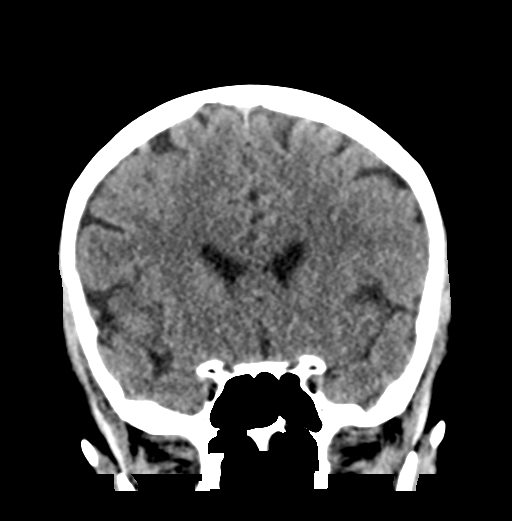
[im 28/63  brain]
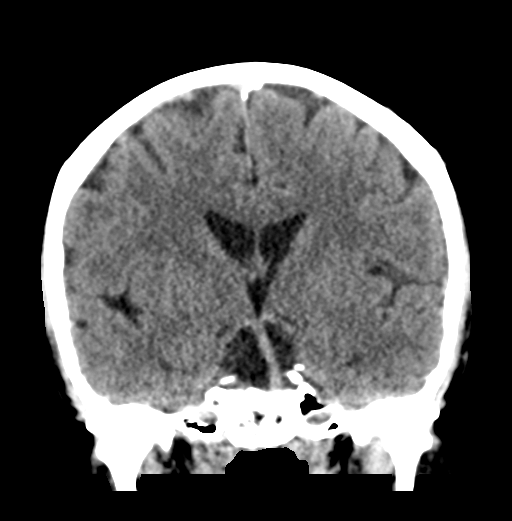
[im 35/63  brain]
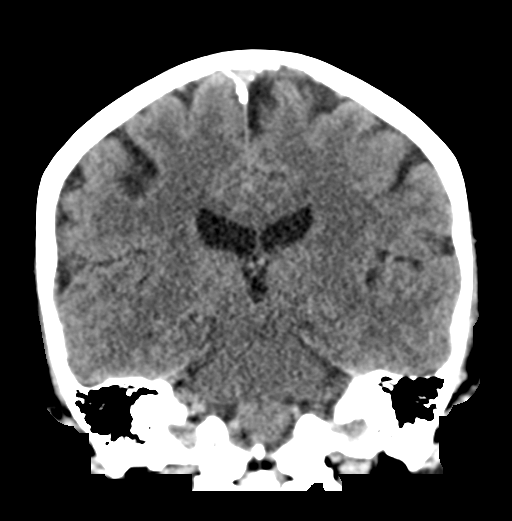

[Series 6: head without sag · sagittal · non-contrast · 0.33mm/px · 3 of 52 slices shown]
[im 18/52  brain]
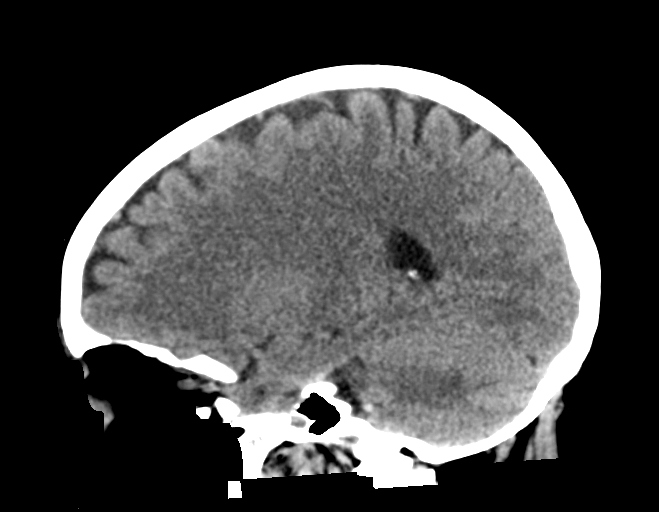
[im 26/52  brain]
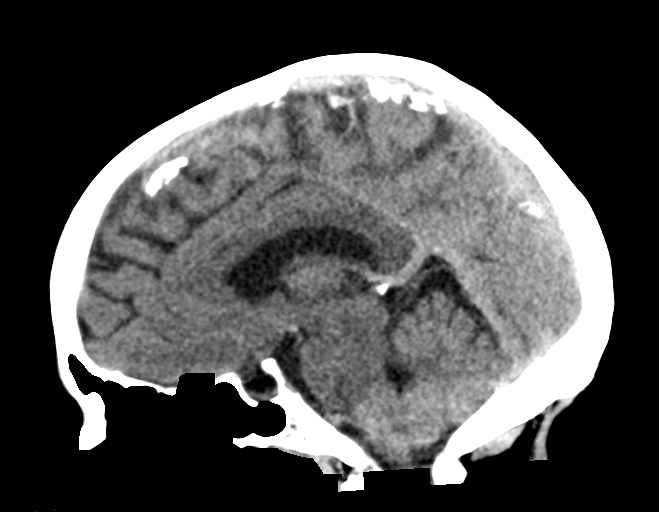
[im 35/52  brain]
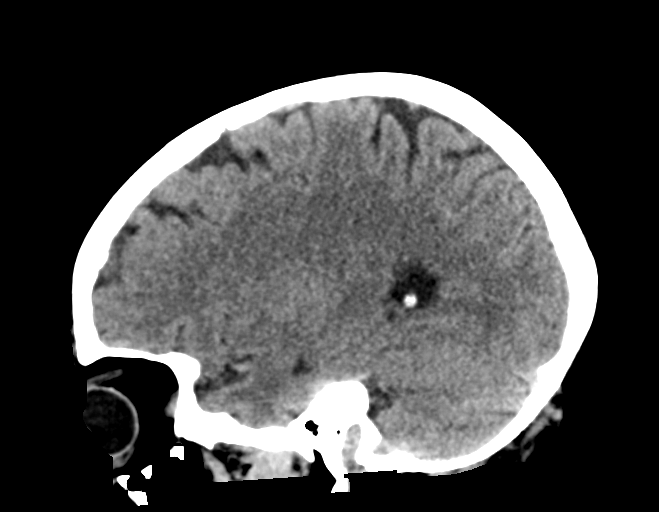

[16 of 47 positions shown; findings below may reference images not displayed]

FINDINGS: Brain: Ventricles and sulci are normal in size and configuration.
There is no intracranial mass, hemorrhage, extra-axial fluid
collection, or midline shift. The brain parenchyma appears
unremarkable. No evident acute infarct. There is mild CSF extension
into the sella, a stable finding of doubtful clinical significance.

Vascular: No hyperdense vessel. Foci of calcification noted in each
carotid siphon region.

Skull: Bony calvarium a appears intact.

Sinuses/Orbits: There is opacification in a portion of a right
ethmoid air cell. Other visualized paranasal sinuses are clear.
Orbits appear symmetric bilaterally.

Other: Visualized mastoid air cells are clear.
IMPRESSION: Brain parenchyma appears unremarkable.  No mass or hemorrhage.

There are foci of arterial vascular calcification. There is slight
right ethmoid sinus disease. 3

## 2022-02-20 DIAGNOSIS — E559 Vitamin D deficiency, unspecified: Secondary | ICD-10-CM | POA: Diagnosis not present

## 2022-02-20 DIAGNOSIS — I10 Essential (primary) hypertension: Secondary | ICD-10-CM | POA: Diagnosis not present

## 2022-02-20 DIAGNOSIS — I700 Atherosclerosis of aorta: Secondary | ICD-10-CM | POA: Diagnosis not present

## 2022-02-20 DIAGNOSIS — Z79899 Other long term (current) drug therapy: Secondary | ICD-10-CM | POA: Diagnosis not present

## 2022-02-20 DIAGNOSIS — R946 Abnormal results of thyroid function studies: Secondary | ICD-10-CM | POA: Diagnosis not present

## 2022-02-20 DIAGNOSIS — R7309 Other abnormal glucose: Secondary | ICD-10-CM | POA: Diagnosis not present

## 2022-02-27 DIAGNOSIS — D3502 Benign neoplasm of left adrenal gland: Secondary | ICD-10-CM | POA: Diagnosis not present

## 2022-02-27 DIAGNOSIS — J3089 Other allergic rhinitis: Secondary | ICD-10-CM | POA: Diagnosis not present

## 2022-02-27 DIAGNOSIS — Z87898 Personal history of other specified conditions: Secondary | ICD-10-CM | POA: Diagnosis not present

## 2022-02-27 DIAGNOSIS — I700 Atherosclerosis of aorta: Secondary | ICD-10-CM | POA: Diagnosis not present

## 2022-02-27 DIAGNOSIS — E7849 Other hyperlipidemia: Secondary | ICD-10-CM | POA: Diagnosis not present

## 2022-02-27 DIAGNOSIS — E559 Vitamin D deficiency, unspecified: Secondary | ICD-10-CM | POA: Diagnosis not present

## 2022-02-27 DIAGNOSIS — Z8679 Personal history of other diseases of the circulatory system: Secondary | ICD-10-CM | POA: Diagnosis not present

## 2022-02-27 DIAGNOSIS — F17200 Nicotine dependence, unspecified, uncomplicated: Secondary | ICD-10-CM | POA: Diagnosis not present

## 2022-04-19 DIAGNOSIS — Z0100 Encounter for examination of eyes and vision without abnormal findings: Secondary | ICD-10-CM | POA: Diagnosis not present

## 2022-05-28 ENCOUNTER — Other Ambulatory Visit: Payer: Self-pay

## 2022-05-28 ENCOUNTER — Emergency Department (HOSPITAL_BASED_OUTPATIENT_CLINIC_OR_DEPARTMENT_OTHER): Payer: Medicare Other

## 2022-05-28 ENCOUNTER — Emergency Department (HOSPITAL_BASED_OUTPATIENT_CLINIC_OR_DEPARTMENT_OTHER)
Admission: EM | Admit: 2022-05-28 | Discharge: 2022-05-28 | Disposition: A | Discharge status: Home / Self Care | Payer: Medicare Other | Attending: Emergency Medicine | Admitting: Emergency Medicine

## 2022-05-28 ENCOUNTER — Encounter (HOSPITAL_BASED_OUTPATIENT_CLINIC_OR_DEPARTMENT_OTHER): Payer: Self-pay | Admitting: Emergency Medicine

## 2022-05-28 DIAGNOSIS — R1011 Right upper quadrant pain: Secondary | ICD-10-CM

## 2022-05-28 DIAGNOSIS — I10 Essential (primary) hypertension: Secondary | ICD-10-CM | POA: Insufficient documentation

## 2022-05-28 DIAGNOSIS — K8020 Calculus of gallbladder without cholecystitis without obstruction: Secondary | ICD-10-CM | POA: Insufficient documentation

## 2022-05-28 DIAGNOSIS — Z79899 Other long term (current) drug therapy: Secondary | ICD-10-CM | POA: Insufficient documentation

## 2022-05-28 DIAGNOSIS — R1033 Periumbilical pain: Secondary | ICD-10-CM | POA: Diagnosis present

## 2022-05-28 DIAGNOSIS — R109 Unspecified abdominal pain: Secondary | ICD-10-CM | POA: Diagnosis not present

## 2022-05-28 DIAGNOSIS — R112 Nausea with vomiting, unspecified: Secondary | ICD-10-CM | POA: Diagnosis not present

## 2022-05-28 DIAGNOSIS — I700 Atherosclerosis of aorta: Secondary | ICD-10-CM | POA: Diagnosis not present

## 2022-05-28 DIAGNOSIS — R748 Abnormal levels of other serum enzymes: Secondary | ICD-10-CM | POA: Insufficient documentation

## 2022-05-28 LAB — URINALYSIS, ROUTINE W REFLEX MICROSCOPIC
Bilirubin Urine: NEGATIVE
Glucose, UA: NEGATIVE mg/dL
Hgb urine dipstick: NEGATIVE
Ketones, ur: NEGATIVE mg/dL
Leukocytes,Ua: NEGATIVE
Nitrite: NEGATIVE
Specific Gravity, Urine: 1.03 (ref 1.005–1.030)
pH: 6.5 (ref 5.0–8.0)

## 2022-05-28 LAB — COMPREHENSIVE METABOLIC PANEL
ALT: 16 U/L (ref 0–44)
AST: 9 U/L — ABNORMAL LOW (ref 15–41)
Albumin: 4.3 g/dL (ref 3.5–5.0)
Alkaline Phosphatase: 74 U/L (ref 38–126)
Anion gap: 11 (ref 5–15)
BUN: 21 mg/dL (ref 8–23)
CO2: 23 mmol/L (ref 22–32)
Calcium: 10.1 mg/dL (ref 8.9–10.3)
Chloride: 105 mmol/L (ref 98–111)
Creatinine, Ser: 0.81 mg/dL (ref 0.44–1.00)
GFR, Estimated: >60 mL/min (ref >60)
Glucose, Bld: 111 mg/dL — ABNORMAL HIGH (ref 70–99)
Potassium: 4.2 mmol/L (ref 3.5–5.1)
Sodium: 139 mmol/L (ref 135–145)
Total Bilirubin: 0.3 mg/dL (ref 0.3–1.2)
Total Protein: 7.1 g/dL (ref 6.5–8.1)

## 2022-05-28 LAB — CBC
HCT: 44.2 % (ref 36.0–46.0)
Hemoglobin: 14.8 g/dL (ref 12.0–15.0)
MCH: 31.2 pg (ref 26.0–34.0)
MCHC: 33.5 g/dL (ref 30.0–36.0)
MCV: 93.2 fL (ref 80.0–100.0)
Platelets: 284 10*3/uL (ref 150–400)
RBC: 4.74 MIL/uL (ref 3.87–5.11)
RDW: 13.2 % (ref 11.5–15.5)
WBC: 6.3 10*3/uL (ref 4.0–10.5)
nRBC: 0 % (ref 0.0–0.2)

## 2022-05-28 LAB — LIPASE, BLOOD: Lipase: 53 U/L — ABNORMAL HIGH (ref 11–51)

## 2022-05-28 MED ORDER — IOHEXOL 300 MG/ML  SOLN
100.0000 mL | Freq: Once | INTRAMUSCULAR | Status: AC | PRN
  Administered 2022-05-28: 100 mL via INTRAVENOUS

## 2022-05-28 MED ORDER — DICYCLOMINE HCL 20 MG PO TABS: 20.0000 mg | ORAL_TABLET | Freq: Two times a day (BID) | ORAL | 0 refills | Status: DC

## 2022-05-28 MED ORDER — DICYCLOMINE HCL 10 MG/ML IM SOLN
20.0000 mg | Freq: Once | INTRAMUSCULAR | Status: AC
Start: 2022-05-28 — End: 2022-05-28
  Administered 2022-05-28: 20 mg via INTRAMUSCULAR
  Filled 2022-05-28: qty 2

## 2022-05-28 MED ORDER — ONDANSETRON 4 MG PO TBDP: 4.0000 mg | ORAL_TABLET | Freq: Three times a day (TID) | ORAL | 0 refills | Status: DC | PRN

## 2022-05-28 MED ORDER — ONDANSETRON HCL 4 MG/2ML IJ SOLN
4.0000 mg | Freq: Once | INTRAMUSCULAR | Status: AC
Start: 2022-05-28 — End: 2022-05-28
  Administered 2022-05-28: 4 mg via INTRAVENOUS
  Filled 2022-05-28: qty 2

## 2022-05-28 MED ORDER — OXYCODONE HCL 5 MG PO TABS: 5.0000 mg | ORAL_TABLET | ORAL | 0 refills | Status: DC | PRN

## 2022-05-28 NOTE — ED Provider Notes (Signed)
[facility] Provider Note   CSN: [id number] Arrival date & time: 05/28/22  1528     History {Add pertinent medical, surgical, social history, OB history to HPI:1} Chief Complaint  Patient presents with   Abdominal Pain    Tanya Patton is a 66 y.o. female.  HPI     66yo female with history hypertension, familial hyperlipidemia, who presents with concern for abdominal pain.  Pain over the last month, worse over the last week, radiating to the lower back. Sharp, cramping. Sleeps on knees, feels better in that position, keeps her up at night.  Periumbilical and to the right and radiates to the back. No urinary problems. Has constipation, had a movemnet today after taking mild of magnesia.  Nausea, no vomiting.  Pain worse with eating, not any particular foods.  No fever.     Past Medical History:  Diagnosis Date   Complication of anesthesia    woke up fighting   Hypertension    Seasonal allergies    Spinal stenosis     Past Surgical History:  Procedure Laterality Date   arm surg Left    BACK SURGERY     BREAST BIOPSY Left 2015   CERVICAL FUSION     COLONOSCOPY  2011    Home Medications Prior to Admission medications   Medication Sig Start Date End Date Taking? Authorizing Provider  acetaminophen (TYLENOL) 500 MG tablet Take 1 tablet (500 mg total) by mouth every 6 (six) hours as needed for mild pain or moderate pain. 11/03/15   [name], PA-C  cyclobenzaprine (FLEXERIL) 10 MG tablet Take 10 mg by mouth 3 (three) times daily as needed for muscle spasms.    [provider]  levocetirizine (XYZAL) 5 MG tablet Take 5 mg by mouth every evening.    [provider]  lisinopril-hydrochlorothiazide (PRINZIDE,ZESTORETIC) 10-12.5 MG per tablet Take 1 tablet by mouth daily.    [provider]  losartan (COZAAR) 25 MG tablet Take 25 mg by mouth daily.    [provider]  metFORMIN (GLUCOPHAGE) 500 MG tablet  Take by mouth 2 (two) times daily with a meal.    [provider]  montelukast (SINGULAIR) 10 MG tablet Take 10 mg by mouth at bedtime.    [provider]  naproxen (NAPROSYN) 250 MG tablet Take by mouth 2 (two) times daily with a meal.    [provider]  ondansetron (ZOFRAN ODT) 4 MG disintegrating tablet Take 1 tablet (4 mg total) by mouth every 8 (eight) hours as needed for nausea or vomiting. 09/25/20   Eustaquio Maize, PA-C  oxyCODONE-acetaminophen (PERCOCET/ROXICET) 5-325 MG per tablet Take 1-2 tablets by mouth every 4 (four) hours as needed for moderate pain. 10/02/13   Karie Chimera, MD  Probiotic Product (ALIGN) 4 MG CAPS Take 1 capsule by mouth daily.    [provider]  rosuvastatin (CRESTOR) 20 MG tablet Take 20 mg by mouth daily.    [provider]      Allergies    Atorvastatin and Shellfish allergy    Review of Systems   Review of Systems  Physical Exam Updated Vital Signs BP (!) 149/95   Pulse 97   Temp 98.1 F (36.7 C) (Temporal)   Resp 18   Ht 5' (1.524 m)   Wt 78 kg   SpO2 99%   BMI 33.59 kg/m  Physical Exam Vitals and nursing note reviewed.  Constitutional:      General: She is  not in acute distress.    Appearance: She is well-developed. She is not diaphoretic.  HENT:     Head: Normocephalic and atraumatic.  Eyes:     Conjunctiva/sclera: Conjunctivae normal.  Cardiovascular:     Rate and Rhythm: Normal rate and regular rhythm.     Heart sounds: Normal heart sounds. No murmur heard.    No friction rub. No gallop.  Pulmonary:     Effort: Pulmonary effort is normal. No respiratory distress.     Breath sounds: Normal breath sounds. No wheezing or rales.  Abdominal:     General: There is no distension.     Palpations: Abdomen is soft.     Tenderness: There is abdominal tenderness in the right upper quadrant, right lower quadrant and periumbilical area. There is no guarding.  Musculoskeletal:        General:  No tenderness.     Cervical back: Normal range of motion.  Skin:    General: Skin is warm and dry.     Findings: No erythema or rash.  Neurological:     Mental Status: She is alert and oriented to person, place, and time.     ED Results / Procedures / Treatments   Labs (all labs ordered are listed, but only abnormal results are displayed) Labs Reviewed  LIPASE, BLOOD - Abnormal; Notable for the following components:      Result Value   Lipase 53 (*)    All other components within normal limits  COMPREHENSIVE METABOLIC PANEL - Abnormal; Notable for the following components:   Glucose, Bld 111 (*)    AST 9 (*)    All other components within normal limits  URINALYSIS, ROUTINE W REFLEX MICROSCOPIC - Abnormal; Notable for the following components:   Protein, ur TRACE (*)    All other components within normal limits  CBC    EKG None  Radiology No results found.  Procedures Procedures  {Document cardiac monitor, telemetry assessment procedure when appropriate:1}  Medications Ordered in ED Medications - No data to display  ED Course/ Medical Decision Making/ A&P                           Medical Decision Making Amount and/or Complexity of Data Reviewed Labs: ordered.   66yo female with history hypertension, familial hyperlipidemia, who presents with concern for abdominal pain.  DDx includes appendicitis, pancreatitis, cholecystitis, pyelonephritis, nephrolithiasis, diverticulitis, SBO, AAA, pelvic etiology.  Labs are completed and personally evaluated by me and showed a lipase which is slightly elevated, however not consistent with pancreatitis.  No sign of transaminitis.  Urinalysis without signs of infection.  CBC with no leukocytosis or anemia. {Document critical care time when appropriate:1} {Document review of labs and clinical decision tools ie heart score, Chads2Vasc2 etc:1}  {Document your independent review of radiology images, and any outside  records:1} {Document your discussion with family members, caretakers, and with consultants:1} {Document social determinants of health affecting pt's care:1} {Document your decision making why or why not admission, treatments were needed:1} Final Clinical Impression(s) / ED Diagnoses Final diagnoses:  None    Rx / DC Orders ED Discharge Orders     None

## 2022-05-28 NOTE — ED Notes (Signed)
Patient transported to CT 

## 2022-05-28 NOTE — ED Triage Notes (Signed)
Pt here from home with c/o lower abd pain radiating around to her back , no urinary symptoms , has problems with constipation but able to go with otc meds

## 2022-05-30 ENCOUNTER — Observation Stay (HOSPITAL_BASED_OUTPATIENT_CLINIC_OR_DEPARTMENT_OTHER)
Admission: EM | Admit: 2022-05-30 | Discharge: 2022-05-31 | Disposition: A | Discharge status: Home / Self Care | Payer: Medicare Other | Attending: General Surgery | Admitting: General Surgery

## 2022-05-30 ENCOUNTER — Encounter (HOSPITAL_COMMUNITY): Payer: Self-pay

## 2022-05-30 ENCOUNTER — Encounter (HOSPITAL_BASED_OUTPATIENT_CLINIC_OR_DEPARTMENT_OTHER): Payer: Self-pay | Admitting: Emergency Medicine

## 2022-05-30 ENCOUNTER — Emergency Department (HOSPITAL_BASED_OUTPATIENT_CLINIC_OR_DEPARTMENT_OTHER): Payer: Medicare Other

## 2022-05-30 ENCOUNTER — Other Ambulatory Visit: Payer: Self-pay

## 2022-05-30 DIAGNOSIS — Z79899 Other long term (current) drug therapy: Secondary | ICD-10-CM | POA: Insufficient documentation

## 2022-05-30 DIAGNOSIS — R1011 Right upper quadrant pain: Secondary | ICD-10-CM | POA: Diagnosis not present

## 2022-05-30 DIAGNOSIS — K839 Disease of biliary tract, unspecified: Secondary | ICD-10-CM | POA: Diagnosis not present

## 2022-05-30 DIAGNOSIS — Z7984 Long term (current) use of oral hypoglycemic drugs: Secondary | ICD-10-CM | POA: Diagnosis not present

## 2022-05-30 DIAGNOSIS — K8020 Calculus of gallbladder without cholecystitis without obstruction: Secondary | ICD-10-CM | POA: Diagnosis not present

## 2022-05-30 DIAGNOSIS — K812 Acute cholecystitis with chronic cholecystitis: Principal | ICD-10-CM | POA: Insufficient documentation

## 2022-05-30 DIAGNOSIS — R109 Unspecified abdominal pain: Secondary | ICD-10-CM | POA: Diagnosis not present

## 2022-05-30 DIAGNOSIS — R1033 Periumbilical pain: Secondary | ICD-10-CM | POA: Diagnosis present

## 2022-05-30 DIAGNOSIS — K838 Other specified diseases of biliary tract: Secondary | ICD-10-CM

## 2022-05-30 DIAGNOSIS — I10 Essential (primary) hypertension: Secondary | ICD-10-CM | POA: Diagnosis not present

## 2022-05-30 DIAGNOSIS — K828 Other specified diseases of gallbladder: Secondary | ICD-10-CM | POA: Diagnosis not present

## 2022-05-30 DIAGNOSIS — F17210 Nicotine dependence, cigarettes, uncomplicated: Secondary | ICD-10-CM | POA: Diagnosis not present

## 2022-05-30 LAB — COMPREHENSIVE METABOLIC PANEL
ALT: 14 U/L (ref 0–44)
AST: 8 U/L — ABNORMAL LOW (ref 15–41)
Albumin: 4.3 g/dL (ref 3.5–5.0)
Alkaline Phosphatase: 77 U/L (ref 38–126)
Anion gap: 11 (ref 5–15)
BUN: 13 mg/dL (ref 8–23)
CO2: 24 mmol/L (ref 22–32)
Calcium: 10.2 mg/dL (ref 8.9–10.3)
Chloride: 105 mmol/L (ref 98–111)
Creatinine, Ser: 0.76 mg/dL (ref 0.44–1.00)
GFR, Estimated: >60 mL/min (ref >60)
Glucose, Bld: 129 mg/dL — ABNORMAL HIGH (ref 70–99)
Potassium: 4.3 mmol/L (ref 3.5–5.1)
Sodium: 140 mmol/L (ref 135–145)
Total Bilirubin: 0.4 mg/dL (ref 0.3–1.2)
Total Protein: 7 g/dL (ref 6.5–8.1)

## 2022-05-30 LAB — CBC
HCT: 44.1 % (ref 36.0–46.0)
Hemoglobin: 14.5 g/dL (ref 12.0–15.0)
MCH: 30.7 pg (ref 26.0–34.0)
MCHC: 32.9 g/dL (ref 30.0–36.0)
MCV: 93.2 fL (ref 80.0–100.0)
Platelets: 285 10*3/uL (ref 150–400)
RBC: 4.73 MIL/uL (ref 3.87–5.11)
RDW: 13.2 % (ref 11.5–15.5)
WBC: 5.9 10*3/uL (ref 4.0–10.5)
nRBC: 0 % (ref 0.0–0.2)

## 2022-05-30 LAB — HIV ANTIBODY (ROUTINE TESTING W REFLEX): HIV Screen 4th Generation wRfx: NONREACTIVE

## 2022-05-30 LAB — HEMOGLOBIN A1C
Hgb A1c MFr Bld: 5.9 % — ABNORMAL HIGH (ref 4.8–5.6)
Mean Plasma Glucose: 122.63 mg/dL

## 2022-05-30 LAB — LIPASE, BLOOD: Lipase: 30 U/L (ref 11–51)

## 2022-05-30 LAB — CBG MONITORING, ED: Glucose-Capillary: 102 mg/dL — ABNORMAL HIGH (ref 70–99)

## 2022-05-30 LAB — GLUCOSE, CAPILLARY: Glucose-Capillary: 112 mg/dL — ABNORMAL HIGH (ref 70–99)

## 2022-05-30 LAB — SURGICAL PCR SCREEN
MRSA, PCR: NEGATIVE
Staphylococcus aureus: POSITIVE — AB

## 2022-05-30 MED ORDER — METHOCARBAMOL 1000 MG/10ML IJ SOLN: 500.0000 mg | Freq: Three times a day (TID) | INTRAVENOUS | Status: DC | PRN

## 2022-05-30 MED ORDER — SIMETHICONE 80 MG PO CHEW: 40.0000 mg | CHEWABLE_TABLET | Freq: Four times a day (QID) | ORAL | Status: DC | PRN

## 2022-05-30 MED ORDER — ENOXAPARIN SODIUM 40 MG/0.4ML IJ SOSY
40.0000 mg | PREFILLED_SYRINGE | INTRAMUSCULAR | Status: DC
Start: 2022-05-30 — End: 2022-05-31
  Administered 2022-05-31: 40 mg via SUBCUTANEOUS
  Filled 2022-05-30: qty 0.4

## 2022-05-30 MED ORDER — INSULIN ASPART 100 UNIT/ML IJ SOLN
0.0000 [IU] | Freq: Three times a day (TID) | INTRAMUSCULAR | Status: DC
  Administered 2022-05-31 (×2): 2 [IU] via SUBCUTANEOUS

## 2022-05-30 MED ORDER — LISINOPRIL-HYDROCHLOROTHIAZIDE 10-12.5 MG PO TABS: 1.0000 | ORAL_TABLET | Freq: Every day | ORAL | Status: DC

## 2022-05-30 MED ORDER — METHOCARBAMOL 500 MG PO TABS: 500.0000 mg | ORAL_TABLET | Freq: Three times a day (TID) | ORAL | Status: DC | PRN

## 2022-05-30 MED ORDER — LOSARTAN POTASSIUM 50 MG PO TABS
25.0000 mg | ORAL_TABLET | Freq: Every day | ORAL | Status: DC
  Administered 2022-05-30: 25 mg via ORAL
  Filled 2022-05-30: qty 1

## 2022-05-30 MED ORDER — OXYCODONE HCL 5 MG PO TABS
5.0000 mg | ORAL_TABLET | ORAL | Status: DC | PRN
  Administered 2022-05-30 (×2): 10 mg via ORAL
  Filled 2022-05-30 (×2): qty 2

## 2022-05-30 MED ORDER — ONDANSETRON 4 MG PO TBDP: 4.0000 mg | ORAL_TABLET | Freq: Four times a day (QID) | ORAL | Status: DC | PRN

## 2022-05-30 MED ORDER — DIPHENHYDRAMINE HCL 50 MG/ML IJ SOLN: 12.5000 mg | Freq: Four times a day (QID) | INTRAMUSCULAR | Status: DC | PRN

## 2022-05-30 MED ORDER — FENTANYL CITRATE PF 50 MCG/ML IJ SOSY
50.0000 ug | PREFILLED_SYRINGE | Freq: Once | INTRAMUSCULAR | Status: AC
  Administered 2022-05-30: 50 ug via INTRAVENOUS
  Filled 2022-05-30: qty 1

## 2022-05-30 MED ORDER — ONDANSETRON HCL 4 MG/2ML IJ SOLN: 4.0000 mg | Freq: Four times a day (QID) | INTRAMUSCULAR | Status: DC | PRN

## 2022-05-30 MED ORDER — HYDRALAZINE HCL 20 MG/ML IJ SOLN: 10.0000 mg | INTRAMUSCULAR | Status: DC | PRN

## 2022-05-30 MED ORDER — SODIUM CHLORIDE 0.45 % IV SOLN
INTRAVENOUS | Status: DC
Start: 2022-05-30 — End: 2022-05-31

## 2022-05-30 MED ORDER — INSULIN ASPART 100 UNIT/ML IJ SOLN: 0.0000 [IU] | Freq: Every day | INTRAMUSCULAR | Status: DC

## 2022-05-30 MED ORDER — PANTOPRAZOLE SODIUM 40 MG IV SOLR
40.0000 mg | Freq: Every day | INTRAVENOUS | Status: DC
Start: 2022-05-30 — End: 2022-05-31
  Administered 2022-05-30: 40 mg via INTRAVENOUS
  Filled 2022-05-30: qty 10

## 2022-05-30 MED ORDER — SODIUM CHLORIDE 0.9 % IV SOLN
2.0000 g | INTRAVENOUS | Status: DC
  Administered 2022-05-30 – 2022-05-31 (×2): 2 g via INTRAVENOUS
  Filled 2022-05-30 (×2): qty 20

## 2022-05-30 MED ORDER — MUPIROCIN 2 % EX OINT
1.0000 | TOPICAL_OINTMENT | Freq: Two times a day (BID) | CUTANEOUS | Status: DC
  Administered 2022-05-30: 1 via NASAL
  Filled 2022-05-30: qty 22

## 2022-05-30 MED ORDER — ACETAMINOPHEN 650 MG RE SUPP: 650.0000 mg | Freq: Four times a day (QID) | RECTAL | Status: DC | PRN

## 2022-05-30 MED ORDER — CYCLOBENZAPRINE HCL 10 MG PO TABS: 10.0000 mg | ORAL_TABLET | Freq: Three times a day (TID) | ORAL | Status: DC | PRN

## 2022-05-30 MED ORDER — MORPHINE SULFATE (PF) 2 MG/ML IV SOLN: 2.0000 mg | INTRAVENOUS | Status: DC | PRN

## 2022-05-30 MED ORDER — ACETAMINOPHEN 325 MG PO TABS: 650.0000 mg | ORAL_TABLET | Freq: Four times a day (QID) | ORAL | Status: DC | PRN

## 2022-05-30 MED ORDER — POLYETHYLENE GLYCOL 3350 17 G PO PACK
17.0000 g | PACK | Freq: Every day | ORAL | Status: DC | PRN
  Filled 2022-05-30: qty 1

## 2022-05-30 MED ORDER — DIPHENHYDRAMINE HCL 12.5 MG/5ML PO ELIX: 12.5000 mg | ORAL_SOLUTION | Freq: Four times a day (QID) | ORAL | Status: DC | PRN

## 2022-05-30 NOTE — ED Provider Notes (Signed)
[facility] DEPT Provider Note   CSN: [id number] Arrival date & time: 05/30/22  1226     History  Chief Complaint  Patient presents with   Abdominal Pain    Tanya Patton is a 66 y.o. female who presents to the emergency department for ongoing RUQ abdominal pain for 3 days. Was seen at ER on 7/31 for similar symptoms and diagnosed with sludge in gallbladder. Was offered ED to ED transfer for cholecystectomy vs outpatient follow up with surgery, she opted for outpatient f/u. Patient reports worsening pain and would like to have her operation as soon as possible. No fevers. Some nausea, no vomiting.   Abdominal Pain Associated symptoms: nausea   Associated symptoms: no chills, no fever and no vomiting        Home Medications Prior to Admission medications   Medication Sig Start Date End Date Taking? Authorizing Provider  acetaminophen (TYLENOL) 500 MG tablet Take 1 tablet (500 mg total) by mouth every 6 (six) hours as needed for mild pain or moderate pain. 11/03/15   [name], PA-C  cyclobenzaprine (FLEXERIL) 10 MG tablet Take 10 mg by mouth 3 (three) times daily as needed for muscle spasms.    [provider]  dicyclomine (BENTYL) 20 MG tablet Take 1 tablet (20 mg total) by mouth 2 (two) times daily. 05/28/22   Gareth Morgan, MD  levocetirizine (XYZAL) 5 MG tablet Take 5 mg by mouth every evening.    [provider]  lisinopril-hydrochlorothiazide (PRINZIDE,ZESTORETIC) 10-12.5 MG per tablet Take 1 tablet by mouth daily.    [provider]  losartan (COZAAR) 25 MG tablet Take 25 mg by mouth daily.    [provider]  metFORMIN (GLUCOPHAGE) 500 MG tablet Take by mouth 2 (two) times daily with a meal.    [provider]  montelukast (SINGULAIR) 10 MG tablet Take 10 mg by mouth at bedtime.    [provider]  naproxen (NAPROSYN) 250 MG tablet Take by mouth 2 (two) times daily with a meal.     [provider]  ondansetron (ZOFRAN-ODT) 4 MG disintegrating tablet Take 1 tablet (4 mg total) by mouth every 8 (eight) hours as needed for nausea or vomiting. 05/28/22   Gareth Morgan, MD  oxyCODONE (ROXICODONE) 5 MG immediate release tablet Take 1 tablet (5 mg total) by mouth every 4 (four) hours as needed for severe pain. 05/28/22   Gareth Morgan, MD  oxyCODONE-acetaminophen (PERCOCET/ROXICET) 5-325 MG per tablet Take 1-2 tablets by mouth every 4 (four) hours as needed for moderate pain. 10/02/13   Karie Chimera, MD  Probiotic Product (ALIGN) 4 MG CAPS Take 1 capsule by mouth daily.    [provider]  rosuvastatin (CRESTOR) 20 MG tablet Take 20 mg by mouth daily.    [provider]      Allergies    Atorvastatin and Shellfish allergy    Review of Systems   Review of Systems  Constitutional:  Negative for chills and fever.  Gastrointestinal:  Positive for abdominal pain and nausea. Negative for vomiting.  All other systems reviewed and are negative.   Physical Exam Updated Vital Signs BP (!) 117/96 (BP Location: Right Arm)   Pulse 71   Temp 97.9 F (36.6 C)   Resp 16   SpO2 99%  Physical Exam Vitals and nursing note reviewed.  Constitutional:      Appearance: Normal appearance.  HENT:     Head: Normocephalic and atraumatic.  Eyes:  Conjunctiva/sclera: Conjunctivae normal.  Cardiovascular:     Rate and Rhythm: Normal rate and regular rhythm.  Pulmonary:     Effort: Pulmonary effort is normal. No respiratory distress.     Breath sounds: Normal breath sounds.  Abdominal:     General: There is no distension.     Palpations: Abdomen is soft.     Tenderness: There is abdominal tenderness in the right upper quadrant. Positive signs include Murphy's sign.  Skin:    General: Skin is warm and dry.  Neurological:     General: No focal deficit present.     Mental Status: She is alert.     ED Results / Procedures / Treatments   Labs (all  labs ordered are listed, but only abnormal results are displayed) Labs Reviewed  COMPREHENSIVE METABOLIC PANEL - Abnormal; Notable for the following components:      Result Value   Glucose, Bld 129 (*)    AST 8 (*)    All other components within normal limits  LIPASE, BLOOD  CBC  HIV ANTIBODY (ROUTINE TESTING W REFLEX)  HEMOGLOBIN A1C    EKG None  Radiology US Abdomen Limited RUQ (LIVER/GB)  Result Date: 05/30/2022 CLINICAL DATA:  Cholelithiasis.  Worsening pain. EXAM: ULTRASOUND ABDOMEN LIMITED RIGHT UPPER QUADRANT COMPARISON:  Right upper quadrant abdominal ultrasound 05/28/2022. CT abdomen and pelvis 05/28/2022. FINDINGS: Gallbladder: Unchanged 8 mm echogenic nonshadowing focus in the gallbladder which may reflect a sludge ball. No shadowing gallstones or wall thickening visualized. No sonographic Murphy sign noted by sonographer. Common bile duct: Diameter: 7 mm Liver: Diffusely increased parenchymal echogenicity without a focal lesion identified. Portal vein is patent on color Doppler imaging with normal direction of blood flow towards the liver. Other: None. IMPRESSION: 1. Unchanged possible sludge ball in the gallbladder. No shadowing gallstones or evidence of acute cholecystitis. 2. Hepatic steatosis. Electronically Signed   By: Logan Bores M.D.   On: 05/30/2022 14:54   US Abdomen Limited RUQ (LIVER/GB)  Result Date: 05/28/2022 CLINICAL DATA:  Right upper quadrant pain with nausea and vomiting. EXAM: ULTRASOUND ABDOMEN LIMITED RIGHT UPPER QUADRANT COMPARISON:  05/28/2022. FINDINGS: Gallbladder: A mobile echogenic focus is noted in the gallbladder, possible sludge ball versus stone. No gallbladder wall thickening. No sonographic Murphy sign noted by sonographer. Common bile duct: Diameter: 3.5 mm. Liver: No focal lesion identified. Increased parenchymal echogenicity. Portal vein is patent on color Doppler imaging with normal direction of blood flow towards the liver. Other: No free  fluid. IMPRESSION: 1. Mobile echogenic focus in the gallbladder, possible sludge ball versus stone. No evidence of acute cholecystitis. 2. Hepatic steatosis. Electronically Signed   By: Brett Fairy M.D.   On: 05/28/2022 20:58   CT ABDOMEN PELVIS W CONTRAST  Result Date: 05/28/2022 CLINICAL DATA:  Right lower quadrant abdominal pain EXAM: CT ABDOMEN AND PELVIS WITH CONTRAST TECHNIQUE: Multidetector CT imaging of the abdomen and pelvis was performed using the standard protocol following bolus administration of intravenous contrast. RADIATION DOSE REDUCTION: This exam was performed according to the departmental dose-optimization program which includes automated exposure control, adjustment of the mA and/or kV according to patient size and/or use of iterative reconstruction technique. CONTRAST:  166m OMNIPAQUE IOHEXOL 300 MG/ML  SOLN COMPARISON:  None Available. FINDINGS: Lower chest: No acute abnormality. Hepatobiliary: No focal liver abnormality is seen. No gallstones, gallbladder wall thickening, or biliary dilatation. Pancreas: Unremarkable. No pancreatic ductal dilatation or surrounding inflammatory changes. Spleen: Normal in size without focal abnormality. Adrenals/Urinary Tract: Nodule in the  left adrenal gland measures approximately 1.6 cm in greatest diameter and is minimally changed since 09/25/2020 and is compatible with benign adenoma. Unremarkable right adrenal gland. Kidneys are normal renal calculi or hydronephrosis. Bladder is unremarkable. Stomach/Bowel: Stomach is within normal limits. Appendix appears normal. No evidence of bowel wall thickening, distention, or inflammatory changes. Vascular/Lymphatic: Aortic atherosclerosis. No enlarged abdominal or pelvic lymph nodes. Reproductive: Uterus and bilateral adnexa are unremarkable. Other: No free intraperitoneal fluid or gas. Musculoskeletal: Posterior fusion L4-S1. No acute osseous abnormality. IMPRESSION: No acute abnormality in the abdomen or  pelvis. Electronically Signed   By: Placido Sou M.D.   On: 05/28/2022 19:47    Procedures Procedures    Medications Ordered in ED Medications  enoxaparin (LOVENOX) injection 40 mg (has no administration in time range)  hydrALAZINE (APRESOLINE) injection 10 mg (has no administration in time range)  pantoprazole (PROTONIX) injection 40 mg (has no administration in time range)  simethicone (MYLICON) chewable tablet 40 mg (has no administration in time range)  ondansetron (ZOFRAN-ODT) disintegrating tablet 4 mg (has no administration in time range)    Or  ondansetron (ZOFRAN) injection 4 mg (has no administration in time range)  polyethylene glycol (MIRALAX / GLYCOLAX) packet 17 g (has no administration in time range)  diphenhydrAMINE (BENADRYL) 12.5 MG/5ML elixir 12.5 mg (has no administration in time range)    Or  diphenhydrAMINE (BENADRYL) injection 12.5 mg (has no administration in time range)  morphine (PF) 2 MG/ML injection 2-4 mg (has no administration in time range)  oxyCODONE (Oxy IR/ROXICODONE) immediate release tablet 5-10 mg (has no administration in time range)  acetaminophen (TYLENOL) tablet 650 mg (has no administration in time range)    Or  acetaminophen (TYLENOL) suppository 650 mg (has no administration in time range)  cefTRIAXone (ROCEPHIN) 2 g in sodium chloride 0.9 % 100 mL IVPB (has no administration in time range)  0.45 % sodium chloride infusion (has no administration in time range)  cyclobenzaprine (FLEXERIL) tablet 10 mg (has no administration in time range)  losartan (COZAAR) tablet 25 mg (has no administration in time range)  insulin aspart (novoLOG) injection 0-15 Units (has no administration in time range)  insulin aspart (novoLOG) injection 0-5 Units (has no administration in time range)  fentaNYL (SUBLIMAZE) injection 50 mcg (50 mcg Intravenous Given 05/30/22 1440)    ED Course/ Medical Decision Making/ A&P                           Medical Decision  Making Amount and/or Complexity of Data Reviewed Labs: ordered. Radiology: ordered.  Risk Prescription drug management. Decision regarding hospitalization.   This patient is a 66 y.o. female  who presents to the ED for concern of continued abdominal pain.   Differential diagnoses prior to evaluation: The emergent differential diagnosis includes, but is not limited to,  AAA, mesenteric ischemia, appendicitis, diverticulitis, DKA, gastritis/gastroenteritis, nephrolithiasis, pancreatitis, constipation, UTI, bowel obstruction, biliary disease, IBD, PUD, hepatitis. This is not an exhaustive differential.   Past Medical History / Co-morbidities: Spinal stenosis, HTN, seasonal allergies  Additional history: Chart reviewed. Pertinent results include: was seen on 7/31 for similar symptoms, dx with gallbladder sludge. Offered transfer for cholecystectomy vs outpatient follow up with surgery. Was discharged for outpatient f/u.  Physical Exam: Physical exam performed. The pertinent findings include: Normal vital signs. RUQ tenderness with positive Murphy's sign.   Lab Tests/Imaging studies: I personally interpreted labs/imaging and the pertinent results include: No leukocytosis, normal hemoglobin.  Glucose 129, otherwise electrolytes within normal limits.  Normal lipase.  Normal kidney and liver function..  Right upper quadrant ultrasound with persistent biliary sludge, no evidence of cholecystitis. .  Medications: I ordered medication including fentanyl.  I have reviewed the patients home medicines and have made adjustments as needed.  Consultations obtained: I consulted with Melina Modena PA-C with general surgery who discussed case with Dr. Grandville Silos and they recommend admission to [facility] for cholecystectomy tomorrow. NPO after midnight.    Disposition: After consideration of the diagnostic results and the patients response to treatment, I feel that patient would benefit from  admission for cholecystectomy for symptomatic biliary sludge.  Final Clinical Impression(s) / ED Diagnoses Final diagnoses:  Biliary sludge  RUQ pain    Rx / DC Orders ED Discharge Orders     None      Portions of this report may have been transcribed using voice recognition software. Every effort was made to ensure accuracy; however, inadvertent computerized transcription errors may be present.    Estill Cotta 05/30/22 1551    Margette Fast, MD 06/04/22 1650

## 2022-05-30 NOTE — ED Notes (Signed)
Fluid held until Antibiotic finished.

## 2022-05-30 NOTE — Discharge Instructions (Addendum)
[facility]  Please arrive at least 30 min before your appointment to complete your check in paperwork.  If you are unable to arrive 30 min prior to your appointment time we may have to cancel or reschedule you. LAPAROSCOPIC SURGERY: POST OP INSTRUCTIONS Always review your discharge instruction sheet given to you by the facility where your surgery was performed. IF YOU HAVE DISABILITY OR FAMILY LEAVE FORMS, YOU MUST BRING THEM TO THE OFFICE FOR PROCESSING.   DO NOT GIVE THEM TO YOUR DOCTOR.  PAIN CONTROL  First take acetaminophen (Tylenol) AND/or ibuprofen (Advil) to control your pain after surgery.  Follow directions on package.  Taking acetaminophen (Tylenol) and/or ibuprofen (Advil) regularly after surgery will help to control your pain and lower the amount of prescription pain medication you may need.  You should not take more than 4,000 mg (4 grams) of acetaminophen (Tylenol) in 24 hours.  You should not take ibuprofen (Advil), aleve, motrin, naprosyn or other NSAIDS if you have a history of stomach ulcers or chronic kidney disease.  A prescription for pain medication may be given to you upon discharge.  Take your pain medication as prescribed, if you still have uncontrolled pain after taking acetaminophen (Tylenol) or ibuprofen (Advil). Use ice packs to help control pain. If you need a refill on your pain medication, please contact your pharmacy.  They will contact our office to request authorization. Prescriptions will not be filled after 5pm or on week-ends.  HOME MEDICATIONS Take your usually prescribed medications unless otherwise directed.  DIET You should follow a light diet the first few days after arrival home.  Be sure to include lots of fluids daily. Avoid fatty, fried foods.   CONSTIPATION It is common to experience some constipation after surgery and if you are taking pain medication.  Increasing fluid intake and taking a stool softener (such as Colace)  will usually help or prevent this problem from occurring.  A mild laxative (Milk of Magnesia or Miralax) should be taken according to package instructions if there are no bowel movements after 48 hours.  WOUND/INCISION CARE Most patients will experience some swelling and bruising in the area of the incisions.  Ice packs will help.  Swelling and bruising can take several days to resolve.  Unless discharge instructions indicate otherwise, follow guidelines below  STERI-STRIPS - you may remove your outer bandages 48 hours after surgery, and you may shower at that time.  You have steri-strips (small skin tapes) in place directly over the incision.  These strips should be left on the skin for 7-10 days.   DERMABOND/SKIN GLUE - you may shower in 24 hours.  The glue will flake off over the next 2-3 weeks. Any sutures or staples will be removed at the office during your follow-up visit.  ACTIVITIES You may resume regular (light) daily activities beginning the next day--such as daily self-care, walking, climbing stairs--gradually increasing activities as tolerated.  You may have sexual intercourse when it is comfortable.  Refrain from any heavy lifting or straining until approved by your doctor. You may drive when you are no longer taking prescription pain medication, you can comfortably wear a seatbelt, and you can safely maneuver your car and apply brakes.  FOLLOW-UP You should see your doctor in the office for a follow-up appointment approximately 2-3 weeks after your surgery.  You should have been given your post-op/follow-up appointment when your surgery was scheduled.  If you did not receive a post-op/follow-up appointment, make sure   that you call for this appointment within a day or two after you arrive home to insure a convenient appointment time.   WHEN TO CALL YOUR DOCTOR: Fever over 101.0 Inability to urinate Continued bleeding from incision. Increased pain, redness, or drainage from the  incision. Increasing abdominal pain  The clinic staff is available to answer your questions during regular business hours.  Please don't hesitate to call and ask to speak to one of the nurses for clinical concerns.  If you have a medical emergency, go to the nearest emergency room or call 911.  A surgeon from [facility] is always on call at the hospital. [address], [address], [place], [place]  [address] ? [address] [address], [place], [place]   [address] [phone] ? [phone] ? FAX [phone]  

## 2022-05-30 NOTE — ED Triage Notes (Incomplete)
Pt arrives to ED with c/o abdominal pain with radiation to her back x1 week. Pt was seen on 7/31 and dx with cholelithiasis and has a f/u appointment next Friday with a surgeon. However, pt reports the pain has increased.

## 2022-05-30 NOTE — ED Notes (Signed)
Report was given to the RN on the Floor.

## 2022-05-31 ENCOUNTER — Observation Stay (HOSPITAL_COMMUNITY): Payer: Medicare Other | Admitting: Certified Registered Nurse Anesthetist

## 2022-05-31 ENCOUNTER — Encounter (HOSPITAL_COMMUNITY)
Admission: EM | Disposition: A | Discharge status: Home / Self Care | Payer: Self-pay | Source: Home / Self Care | Attending: Emergency Medicine

## 2022-05-31 ENCOUNTER — Observation Stay (HOSPITAL_BASED_OUTPATIENT_CLINIC_OR_DEPARTMENT_OTHER): Payer: Medicare Other | Admitting: Certified Registered Nurse Anesthetist

## 2022-05-31 ENCOUNTER — Other Ambulatory Visit: Payer: Self-pay

## 2022-05-31 ENCOUNTER — Encounter (HOSPITAL_COMMUNITY): Payer: Self-pay

## 2022-05-31 DIAGNOSIS — K8010 Calculus of gallbladder with chronic cholecystitis without obstruction: Secondary | ICD-10-CM | POA: Diagnosis not present

## 2022-05-31 DIAGNOSIS — K660 Peritoneal adhesions (postprocedural) (postinfection): Secondary | ICD-10-CM

## 2022-05-31 DIAGNOSIS — K810 Acute cholecystitis: Secondary | ICD-10-CM | POA: Diagnosis not present

## 2022-05-31 DIAGNOSIS — K812 Acute cholecystitis with chronic cholecystitis: Secondary | ICD-10-CM | POA: Diagnosis not present

## 2022-05-31 HISTORY — PX: CHOLECYSTECTOMY: SHX55

## 2022-05-31 LAB — GLUCOSE, CAPILLARY
Glucose-Capillary: 103 mg/dL — ABNORMAL HIGH (ref 70–99)
Glucose-Capillary: 148 mg/dL — ABNORMAL HIGH (ref 70–99)
Glucose-Capillary: 140 mg/dL — ABNORMAL HIGH (ref 70–99)

## 2022-05-31 SURGERY — LAPAROSCOPIC CHOLECYSTECTOMY
Anesthesia: General | Site: Abdomen

## 2022-05-31 MED ORDER — BUPIVACAINE-EPINEPHRINE 0.25% -1:200000 IJ SOLN
INTRAMUSCULAR | Status: DC | PRN
  Administered 2022-05-31: 30 mL

## 2022-05-31 MED ORDER — BUPIVACAINE-EPINEPHRINE (PF) 0.25% -1:200000 IJ SOLN
INTRAMUSCULAR | Status: AC
  Filled 2022-05-31: qty 30

## 2022-05-31 MED ORDER — DEXAMETHASONE SODIUM PHOSPHATE 10 MG/ML IJ SOLN
INTRAMUSCULAR | Status: DC | PRN
  Administered 2022-05-31: 5 mg via INTRAVENOUS

## 2022-05-31 MED ORDER — FENTANYL CITRATE (PF) 250 MCG/5ML IJ SOLN
INTRAMUSCULAR | Status: AC
  Filled 2022-05-31: qty 5

## 2022-05-31 MED ORDER — FENTANYL CITRATE (PF) 250 MCG/5ML IJ SOLN
INTRAMUSCULAR | Status: DC | PRN
Start: 2022-05-31 — End: 2022-05-31
  Administered 2022-05-31: 50 ug via INTRAVENOUS
  Administered 2022-05-31: 100 ug via INTRAVENOUS
  Administered 2022-05-31 (×2): 50 ug via INTRAVENOUS

## 2022-05-31 MED ORDER — MORPHINE SULFATE (PF) 2 MG/ML IV SOLN: 2.0000 mg | INTRAVENOUS | Status: DC | PRN

## 2022-05-31 MED ORDER — MIDAZOLAM HCL 2 MG/2ML IJ SOLN
INTRAMUSCULAR | Status: DC | PRN
  Administered 2022-05-31: 2 mg via INTRAVENOUS

## 2022-05-31 MED ORDER — KETOROLAC TROMETHAMINE 30 MG/ML IJ SOLN
INTRAMUSCULAR | Status: DC | PRN
  Administered 2022-05-31: 30 mg via INTRAVENOUS

## 2022-05-31 MED ORDER — CHLORHEXIDINE GLUCONATE 0.12 % MT SOLN: 15.0000 mL | Freq: Once | OROMUCOSAL | Status: AC

## 2022-05-31 MED ORDER — ROCURONIUM BROMIDE 10 MG/ML (PF) SYRINGE
PREFILLED_SYRINGE | INTRAVENOUS | Status: DC | PRN
  Administered 2022-05-31: 60 mg via INTRAVENOUS

## 2022-05-31 MED ORDER — ALBUTEROL SULFATE HFA 108 (90 BASE) MCG/ACT IN AERS
INHALATION_SPRAY | RESPIRATORY_TRACT | Status: DC | PRN
  Administered 2022-05-31: 8 via RESPIRATORY_TRACT

## 2022-05-31 MED ORDER — 0.9 % SODIUM CHLORIDE (POUR BTL) OPTIME
TOPICAL | Status: DC | PRN
  Administered 2022-05-31: 1000 mL

## 2022-05-31 MED ORDER — SODIUM CHLORIDE 0.9 % IR SOLN
Status: DC | PRN
  Administered 2022-05-31: 1000 mL

## 2022-05-31 MED ORDER — ONDANSETRON HCL 4 MG/2ML IJ SOLN
INTRAMUSCULAR | Status: DC | PRN
  Administered 2022-05-31: 4 mg via INTRAVENOUS

## 2022-05-31 MED ORDER — LIDOCAINE 2% (20 MG/ML) 5 ML SYRINGE
INTRAMUSCULAR | Status: DC | PRN
  Administered 2022-05-31: 100 mg via INTRAVENOUS

## 2022-05-31 MED ORDER — ACETAMINOPHEN 10 MG/ML IV SOLN
INTRAVENOUS | Status: AC
  Filled 2022-05-31: qty 100

## 2022-05-31 MED ORDER — ORAL CARE MOUTH RINSE: 15.0000 mL | Freq: Once | OROMUCOSAL | Status: AC

## 2022-05-31 MED ORDER — FENTANYL CITRATE (PF) 100 MCG/2ML IJ SOLN
INTRAMUSCULAR | Status: AC
  Filled 2022-05-31: qty 2

## 2022-05-31 MED ORDER — SUGAMMADEX SODIUM 200 MG/2ML IV SOLN
INTRAVENOUS | Status: DC | PRN
  Administered 2022-05-31: 200 mg via INTRAVENOUS

## 2022-05-31 MED ORDER — MIDAZOLAM HCL 2 MG/2ML IJ SOLN
INTRAMUSCULAR | Status: AC
  Filled 2022-05-31: qty 2

## 2022-05-31 MED ORDER — CHLORHEXIDINE GLUCONATE 0.12 % MT SOLN
OROMUCOSAL | Status: AC
  Administered 2022-05-31: 15 mL via OROMUCOSAL
  Filled 2022-05-31: qty 15

## 2022-05-31 MED ORDER — ACETAMINOPHEN 10 MG/ML IV SOLN
INTRAVENOUS | Status: DC | PRN
  Administered 2022-05-31: 1000 mg via INTRAVENOUS

## 2022-05-31 MED ORDER — LACTATED RINGERS IV SOLN: INTRAVENOUS | Status: DC

## 2022-05-31 MED ORDER — PROPOFOL 10 MG/ML IV BOLUS
INTRAVENOUS | Status: DC | PRN
  Administered 2022-05-31: 120 mg via INTRAVENOUS

## 2022-05-31 MED ORDER — OXYCODONE HCL 5 MG PO TABS
5.0000 mg | ORAL_TABLET | ORAL | Status: DC | PRN
  Administered 2022-05-31: 10 mg via ORAL
  Filled 2022-05-31: qty 2

## 2022-05-31 MED ORDER — FENTANYL CITRATE (PF) 100 MCG/2ML IJ SOLN
25.0000 ug | INTRAMUSCULAR | Status: DC | PRN
  Administered 2022-05-31: 50 ug via INTRAVENOUS

## 2022-05-31 MED ORDER — PROPOFOL 10 MG/ML IV BOLUS
INTRAVENOUS | Status: AC
  Filled 2022-05-31: qty 20

## 2022-05-31 SURGICAL SUPPLY — 43 items
APPLIER CLIP 5 13 M/L LIGAMAX5 (MISCELLANEOUS) ×3
BLADE CLIPPER SURG (BLADE) IMPLANT
CANISTER SUCT 3000ML PPV (MISCELLANEOUS) ×3 IMPLANT
CHLORAPREP W/TINT 26 (MISCELLANEOUS) ×3 IMPLANT
CLIP APPLIE 5 13 M/L LIGAMAX5 (MISCELLANEOUS) ×2 IMPLANT
COVER MAYO STAND STRL (DRAPES) ×3 IMPLANT
COVER SURGICAL LIGHT HANDLE (MISCELLANEOUS) ×3 IMPLANT
DERMABOND ADVANCED (GAUZE/BANDAGES/DRESSINGS) ×1
DERMABOND ADVANCED .7 DNX12 (GAUZE/BANDAGES/DRESSINGS) ×2 IMPLANT
DRAPE C-ARM 42X120 X-RAY (DRAPES) ×3 IMPLANT
ELECT REM PT RETURN 9FT ADLT (ELECTROSURGICAL) ×3
ELECTRODE REM PT RTRN 9FT ADLT (ELECTROSURGICAL) ×2 IMPLANT
GLOVE BIO SURGEON STRL SZ8 (GLOVE) ×3 IMPLANT
GLOVE BIOGEL PI IND STRL 8 (GLOVE) ×2 IMPLANT
GLOVE BIOGEL PI INDICATOR 8 (GLOVE) ×1
GOWN STRL REUS W/ TWL LRG LVL3 (GOWN DISPOSABLE) ×4 IMPLANT
GOWN STRL REUS W/ TWL XL LVL3 (GOWN DISPOSABLE) ×2 IMPLANT
GOWN STRL REUS W/TWL LRG LVL3 (GOWN DISPOSABLE) ×6
GOWN STRL REUS W/TWL XL LVL3 (GOWN DISPOSABLE) ×3
KIT BASIN OR (CUSTOM PROCEDURE TRAY) ×3 IMPLANT
KIT TURNOVER KIT B (KITS) ×3 IMPLANT
L-HOOK LAP DISP 36CM (ELECTROSURGICAL) ×3
LHOOK LAP DISP 36CM (ELECTROSURGICAL) ×2 IMPLANT
NEEDLE 22X1 1/2 (OR ONLY) (NEEDLE) ×3 IMPLANT
NS IRRIG 1000ML POUR BTL (IV SOLUTION) ×3 IMPLANT
PAD ARMBOARD 7.5X6 YLW CONV (MISCELLANEOUS) ×3 IMPLANT
PENCIL BUTTON HOLSTER BLD 10FT (ELECTRODE) ×3 IMPLANT
POUCH RETRIEVAL ECOSAC 10 (ENDOMECHANICALS) ×2 IMPLANT
POUCH RETRIEVAL ECOSAC 10MM (ENDOMECHANICALS) ×3
SCISSORS LAP 5X35 DISP (ENDOMECHANICALS) ×3 IMPLANT
SET CHOLANGIOGRAPH 5 50 .035 (SET/KITS/TRAYS/PACK) ×1 IMPLANT
SET IRRIG TUBING LAPAROSCOPIC (IRRIGATION / IRRIGATOR) ×3 IMPLANT
SET TUBE SMOKE EVAC HIGH FLOW (TUBING) ×3 IMPLANT
SLEEVE ENDOPATH XCEL 5M (ENDOMECHANICALS) ×6 IMPLANT
SPECIMEN JAR SMALL (MISCELLANEOUS) ×3 IMPLANT
SUT VIC AB 4-0 PS2 27 (SUTURE) ×3 IMPLANT
TOWEL GREEN STERILE (TOWEL DISPOSABLE) ×3 IMPLANT
TOWEL GREEN STERILE FF (TOWEL DISPOSABLE) ×3 IMPLANT
TRAY LAPAROSCOPIC MC (CUSTOM PROCEDURE TRAY) ×3 IMPLANT
TROCAR XCEL BLUNT TIP 100MML (ENDOMECHANICALS) ×3 IMPLANT
TROCAR Z-THREAD OPTICAL 5X100M (TROCAR) ×3 IMPLANT
WARMER LAPAROSCOPE (MISCELLANEOUS) ×3 IMPLANT
WATER STERILE IRR 1000ML POUR (IV SOLUTION) ×3 IMPLANT

## 2022-05-31 NOTE — Transfer of Care (Signed)
Immediate Anesthesia Transfer of Care Note  Patient: Tanya Patton  Procedure(s) Performed: LAPAROSCOPIC CHOLECYSTECTOMY (Abdomen)  Patient Location: PACU  Anesthesia Type:General  Level of Consciousness: awake, alert  and oriented  Airway & Oxygen Therapy: Patient Spontanous Breathing  Post-op Assessment: Report given to RN and Post -op Vital signs reviewed and stable  Post vital signs: Reviewed and stable  Last Vitals:  Vitals Value Taken Time  BP 134/69 05/31/22 1012  Temp    Pulse 91 05/31/22 1015  Resp 18 05/31/22 1015  SpO2 91 % 05/31/22 1015  Vitals shown include unvalidated device data.  Last Pain:  Vitals:   05/31/22 0822  TempSrc:   PainSc: 4       Patients Stated Pain Goal: 0 (14/15/97 3312)  Complications: No notable events documented.

## 2022-05-31 NOTE — Anesthesia Preprocedure Evaluation (Signed)
Anesthesia Evaluation  Patient identified by MRN, date of birth, ID band Patient awake    Reviewed: Allergy & Precautions, NPO status , Patient's Chart, lab work & pertinent test results  History of Anesthesia Complications Negative for: history of anesthetic complications  Airway Mallampati: II  TM Distance: >3 FB Neck ROM: Full    Dental  (+) Dental Advisory Given   Pulmonary neg pulmonary ROS, Current Smoker,    Pulmonary exam normal        Cardiovascular hypertension, Normal cardiovascular exam     Neuro/Psych negative neurological ROS     GI/Hepatic Neg liver ROS, Acute cholecystitis   Endo/Other  diabetes, Oral Hypoglycemic Agents  Renal/GU negative Renal ROS  negative genitourinary   Musculoskeletal negative musculoskeletal ROS (+)   Abdominal   Peds  Hematology negative hematology ROS (+)   Anesthesia Other Findings   Reproductive/Obstetrics                             Anesthesia Physical Anesthesia Plan  ASA: 2  Anesthesia Plan: General   Post-op Pain Management: Tylenol PO (pre-op)* and Toradol IV (intra-op)*   Induction: Intravenous  PONV Risk Score and Plan: 3 and Ondansetron, Dexamethasone, Treatment may vary due to age or medical condition and Midazolam  Airway Management Planned: Oral ETT  Additional Equipment: None  Intra-op Plan:   Post-operative Plan: Extubation in OR  Informed Consent: I have reviewed the patients History and Physical, chart, labs and discussed the procedure including the risks, benefits and alternatives for the proposed anesthesia with the patient or authorized representative who has indicated his/her understanding and acceptance.     Dental advisory given  Plan Discussed with:   Anesthesia Plan Comments:         Anesthesia Quick Evaluation

## 2022-05-31 NOTE — Plan of Care (Signed)

## 2022-05-31 NOTE — H&P (Signed)
Tanya Patton is an 66 y.o. female.   Chief Complaint: Abdominal pain HPI: Patient is a 66 year old female with a history of abdominal pain periumbilical area radiotherapy quadrant.  States has been going on for the last 2 months.  She was recently ER approximately 2 days ago secondary to same type pain.  She states that usually the pain has been brought on with spicy meals.  Patient underwent work-up on a previous ER visit and was found to have gallstones/sludge in the gallbladder.  Patient with a normal WBC count.  Patient with normal LFTs.  Patient denies any other abdominal surgery.  Past Medical History:  Diagnosis Date   Complication of anesthesia    woke up fighting   Hypertension    Seasonal allergies    Spinal stenosis     Past Surgical History:  Procedure Laterality Date   arm surg Left    BACK SURGERY     BREAST BIOPSY Left 2015   CERVICAL FUSION     COLONOSCOPY  2011    Family History  Problem Relation Age of Onset   Breast cancer Father    Social History:  reports that she has been smoking cigarettes. She has a 30.00 pack-year smoking history. She has never used smokeless tobacco. She reports that she does not drink alcohol and does not use drugs.  Allergies:  Allergies  Allergen Reactions   Atorvastatin     REACTION: jittery   Shellfish Allergy Hives and Itching    Medications Prior to Admission  Medication Sig Dispense Refill   acetaminophen (TYLENOL) 500 MG tablet Take 1 tablet (500 mg total) by mouth every 6 (six) hours as needed for mild pain or moderate pain. 30 tablet 0   cyclobenzaprine (FLEXERIL) 10 MG tablet Take 10 mg by mouth 3 (three) times daily as needed for muscle spasms.     dicyclomine (BENTYL) 20 MG tablet Take 1 tablet (20 mg total) by mouth 2 (two) times daily. 20 tablet 0   levocetirizine (XYZAL) 5 MG tablet Take 5 mg by mouth every evening.     lisinopril-hydrochlorothiazide (PRINZIDE,ZESTORETIC) 10-12.5 MG per tablet Take 1 tablet  by mouth daily.     losartan (COZAAR) 25 MG tablet Take 25 mg by mouth daily.     metFORMIN (GLUCOPHAGE) 500 MG tablet Take by mouth 2 (two) times daily with a meal.     montelukast (SINGULAIR) 10 MG tablet Take 10 mg by mouth at bedtime.     naproxen (NAPROSYN) 250 MG tablet Take by mouth 2 (two) times daily with a meal.     ondansetron (ZOFRAN-ODT) 4 MG disintegrating tablet Take 1 tablet (4 mg total) by mouth every 8 (eight) hours as needed for nausea or vomiting. 20 tablet 0   oxyCODONE (ROXICODONE) 5 MG immediate release tablet Take 1 tablet (5 mg total) by mouth every 4 (four) hours as needed for severe pain. 20 tablet 0   oxyCODONE-acetaminophen (PERCOCET/ROXICET) 5-325 MG per tablet Take 1-2 tablets by mouth every 4 (four) hours as needed for moderate pain. 50 tablet 0   Probiotic Product (ALIGN) 4 MG CAPS Take 1 capsule by mouth daily.     rosuvastatin (CRESTOR) 20 MG tablet Take 20 mg by mouth daily.      Results for orders placed or performed during the hospital encounter of 05/30/22 (from the past 48 hour(s))  Lipase, blood     Status: None   Collection Time: 05/30/22 12:45 PM  Result Value Ref Range  Lipase 30 11 - 51 U/L    Comment: Performed at [facility], [address] [place], [place] [address]  Comprehensive metabolic panel     Status: Abnormal   Collection Time: 05/30/22 12:45 PM  Result Value Ref Range   Sodium 140 135 - 145 mmol/L   Potassium 4.3 3.5 - 5.1 mmol/L   Chloride 105 98 - 111 mmol/L   CO2 24 22 - 32 mmol/L   Glucose, Bld 129 (H) 70 - 99 mg/dL    Comment: Glucose reference range applies only to samples taken after fasting for at least 8 hours.   BUN 13 8 - 23 mg/dL   Creatinine, Ser 0.76 0.44 - 1.00 mg/dL   Calcium 10.2 8.9 - 10.3 mg/dL   Total Protein 7.0 6.5 - 8.1 g/dL   Albumin 4.3 3.5 - 5.0 g/dL   AST 8 (L) 15 - 41 U/L   ALT 14 0 - 44 U/L   Alkaline Phosphatase 77 38 - 126 U/L   Total Bilirubin 0.4 0.3 - 1.2 mg/dL   GFR,  Estimated >60 >60 mL/min    Comment: (NOTE) Calculated using the CKD-EPI Creatinine Equation (2021)    Anion gap 11 5 - 15    Comment: Performed at [facility], [address] [place], [place] [address]  CBC     Status: None   Collection Time: 05/30/22 12:45 PM  Result Value Ref Range   WBC 5.9 4.0 - 10.5 K/uL   RBC 4.73 3.87 - 5.11 MIL/uL   Hemoglobin 14.5 12.0 - 15.0 g/dL   HCT 44.1 36.0 - 46.0 %   MCV 93.2 80.0 - 100.0 fL   MCH 30.7 26.0 - 34.0 pg   MCHC 32.9 30.0 - 36.0 g/dL   RDW 13.2 11.5 - 15.5 %   Platelets 285 150 - 400 K/uL   nRBC 0.0 0.0 - 0.2 %    Comment: Performed at [facility], [address], [place], [place] [address]  CBG monitoring, ED     Status: Abnormal   Collection Time: 05/30/22  5:06 PM  Result Value Ref Range   Glucose-Capillary 102 (H) 70 - 99 mg/dL    Comment: Glucose reference range applies only to samples taken after fasting for at least 8 hours.  HIV Antibody (routine testing w rflx)     Status: None   Collection Time: 05/30/22  5:07 PM  Result Value Ref Range   HIV Screen 4th Generation wRfx Non Reactive Non Reactive    Comment: Performed at [facility], [place] [address], [place], [place] [address]  Hemoglobin A1c     Status: Abnormal   Collection Time: 05/30/22  5:08 PM  Result Value Ref Range   Hgb A1c MFr Bld 5.9 (H) 4.8 - 5.6 %    Comment: (NOTE) Pre diabetes:          5.7%-6.4%  Diabetes:              >6.4%  Glycemic control for   <7.0% adults with diabetes    Mean Plasma Glucose 122.63 mg/dL    Comment: Performed at [facility] [address], [place], [place] [address]  Surgical PCR screen     Status: Abnormal   Collection Time: 05/30/22  6:53 PM   Specimen: Nasal Mucosa; Nasal Swab  Result Value Ref Range   MRSA, PCR NEGATIVE NEGATIVE   Staphylococcus aureus POSITIVE (A) NEGATIVE    Comment: (NOTE) The Xpert  SA Assay (FDA approved for NASAL specimens in patients  64 years of age and older), is one component of a comprehensive surveillance program. It is not intended to diagnose infection nor to guide or monitor treatment. Performed at [facility], [place] [address] [place], [place] [address]   Glucose, capillary     Status: Abnormal   Collection Time: 05/30/22  9:24 PM  Result Value Ref Range   Glucose-Capillary 112 (H) 70 - 99 mg/dL    Comment: Glucose reference range applies only to samples taken after fasting for at least 8 hours.   US Abdomen Limited RUQ (LIVER/GB)  Result Date: 05/30/2022 CLINICAL DATA:  Cholelithiasis.  Worsening pain. EXAM: ULTRASOUND ABDOMEN LIMITED RIGHT UPPER QUADRANT COMPARISON:  Right upper quadrant abdominal ultrasound 05/28/2022. CT abdomen and pelvis 05/28/2022. FINDINGS: Gallbladder: Unchanged 8 mm echogenic nonshadowing focus in the gallbladder which may reflect a sludge ball. No shadowing gallstones or wall thickening visualized. No sonographic Murphy sign noted by sonographer. Common bile duct: Diameter: 7 mm Liver: Diffusely increased parenchymal echogenicity without a focal lesion identified. Portal vein is patent on color Doppler imaging with normal direction of blood flow towards the liver. Other: None. IMPRESSION: 1. Unchanged possible sludge ball in the gallbladder. No shadowing gallstones or evidence of acute cholecystitis. 2. Hepatic steatosis. Electronically Signed   By: Logan Bores M.D.   On: 05/30/2022 14:54    Review of Systems  Constitutional:  Negative for chills and fever.  HENT:  Negative for ear discharge, hearing loss and sore throat.   Eyes:  Negative for discharge.  Respiratory:  Negative for cough and shortness of breath.   Cardiovascular:  Negative for chest pain and leg swelling.  Gastrointestinal:  Positive for abdominal pain and nausea. Negative for constipation, diarrhea and vomiting.  Musculoskeletal:  Negative for myalgias and neck pain.  Skin:  Negative for rash.   Allergic/Immunologic: Negative for environmental allergies.  Neurological:  Negative for dizziness and seizures.  Hematological:  Does not bruise/bleed easily.  Psychiatric/Behavioral:  Negative for suicidal ideas.   All other systems reviewed and are negative.   Blood pressure 122/71, pulse 70, temperature 98 F (36.7 C), temperature source Oral, resp. rate 16, height '5\' 4"'$  (1.626 m), weight 78.9 kg, SpO2 100 %. Physical Exam Constitutional:      Appearance: She is well-developed.     Comments: Conversant No acute distress  HENT:     Head: Normocephalic and atraumatic.  Eyes:     General: Lids are normal. No scleral icterus.    Pupils: Pupils are equal, round, and reactive to light.     Comments: Pupils are equal round and reactive No lid lag Moist conjunctiva  Neck:     Thyroid: No thyromegaly.     Trachea: No tracheal tenderness.     Comments: No cervical lymphadenopathy Cardiovascular:     Rate and Rhythm: Normal rate and regular rhythm.     Heart sounds: No murmur heard. Pulmonary:     Effort: Pulmonary effort is normal.     Breath sounds: Normal breath sounds. No wheezing or rales.  Abdominal:     General: Bowel sounds are normal.     Tenderness: There is abdominal tenderness in the right upper quadrant. There is no guarding or rebound.     Hernia: No hernia is present.  Musculoskeletal:     Cervical back: Normal range of motion and neck supple.  Skin:    General: Skin is warm.  Findings: No rash.     Nails: There is no clubbing.     Comments: Normal skin turgor  Neurological:     Mental Status: She is alert and oriented to person, place, and time.     Comments: Normal gait and station  Psychiatric:        Mood and Affect: Mood normal.        Thought Content: Thought content normal.        Judgment: Judgment normal.     Comments: Appropriate affect      Assessment/Plan 66 year old female with biliary colic HTN Tobacco abuse  1.  We will proceed to  the operating today for diagnostic laparoscopy by Dr. Grandville Silos. 2. All risks and benefits were discussed with the patient to generally include: infection, bleeding, possible need for post op ERCP, damage to the bile ducts, and bile leak. Alternatives were offered and described.  All questions were answered and the patient voiced understanding of the procedure and wishes to proceed at this point with a laparoscopic cholecystectomy   Ralene Ok, MD 05/31/2022, 3:34 AM

## 2022-05-31 NOTE — Anesthesia Procedure Notes (Signed)
Procedure Name: Intubation Date/Time: 05/31/2022 9:03 AM  Performed by: Carolan Clines, CRNAPre-anesthesia Checklist: Patient identified, Emergency Drugs available, Suction available and Patient being monitored Patient Re-evaluated:Patient Re-evaluated prior to induction Oxygen Delivery Method: Circle System Utilized Preoxygenation: Pre-oxygenation with 100% oxygen Induction Type: IV induction Ventilation: Mask ventilation without difficulty and Oral airway inserted - appropriate to patient size Laryngoscope Size: Mac and 3 Grade View: Grade I Tube type: Oral Tube size: 7.0 mm Number of attempts: 1 Airway Equipment and Method: Stylet and Oral airway Placement Confirmation: ETT inserted through vocal cords under direct vision, positive ETCO2 and breath sounds checked- equal and bilateral Secured at: 21 cm Tube secured with: Tape Dental Injury: Teeth and Oropharynx as per pre-operative assessment

## 2022-05-31 NOTE — Progress Notes (Signed)
Mobility Specialist Criteria Algorithm Info.   05/31/22 1630  Mobility  Activity Ambulated independently in hallway  Range of Motion/Exercises Active;All extremities  Level of Assistance Independent  Assistive Device Other (Comment) (IV Pole)  Distance Ambulated (ft) 330 ft  Activity Response Tolerated well   Patient received hallway during self-guided ambulation. Ambulated independently with steady gait. Returned to room without complaint or incident. Was left standing at the bedside with all needs met.  05/31/2022 4:31 PM  [place] Arnell Mausolf, [place], [place]  TLXBW:[phone] Office: [phone]

## 2022-05-31 NOTE — Anesthesia Postprocedure Evaluation (Signed)
Anesthesia Post Note  Patient: Tanya Patton  Procedure(s) Performed: LAPAROSCOPIC CHOLECYSTECTOMY (Abdomen)     Patient location during evaluation: PACU Anesthesia Type: General Level of consciousness: awake and alert Pain management: pain level controlled Vital Signs Assessment: post-procedure vital signs reviewed and stable Respiratory status: spontaneous breathing, nonlabored ventilation and respiratory function stable Cardiovascular status: blood pressure returned to baseline and stable Postop Assessment: no apparent nausea or vomiting Anesthetic complications: no   No notable events documented.  Last Vitals:  Vitals:   05/31/22 1030 05/31/22 1045  BP: (!) 150/69 (!) 147/87  Pulse: 87 78  Resp: 18 14  Temp:  36.7 C  SpO2: 94% 97%    Last Pain:  Vitals:   05/31/22 1030  TempSrc:   PainSc: 10-Worst pain ever                 Lidia Collum

## 2022-05-31 NOTE — Care Management Obs Status (Signed)
[facility] NOTIFICATION   Patient Details  Name: Tanya Patton MRN: [id number] Date of Birth: [birth year]   Medicare Observation Status Notification Given:  Yes    Curlene Labrum, RN 05/31/2022, 12:35 PM

## 2022-05-31 NOTE — Op Note (Signed)
  05/30/2022 - 05/31/2022  9:51 AM  PATIENT:  Tanya Patton  66 y.o. female  PRE-OPERATIVE DIAGNOSIS:  acute cholecystitis  POST-OPERATIVE DIAGNOSIS:  acute cholecystitis, intra-abdominal adhesions RUQ  PROCEDURE:  Procedure(s): LAPAROSCOPIC LYSIS OF ADHESIONS 20 MINUTES LAPAROSCOPIC CHOLECYSTECTOMY  SURGEON:  Surgeon(s): Georganna Skeans, MD  ASSISTANTS: Melina Modena, PA-C   ANESTHESIA:   local and general  EBL:  No intake/output data recorded.  BLOOD ADMINISTERED:none  DRAINS: none   SPECIMEN:  Excision  DISPOSITION OF SPECIMEN:  PATHOLOGY  COUNTS:  YES  DICTATION: .Dragon Dictation Procedure in detail: Informed consent was obtained.  She received intravenous antibiotics.  She was brought to the operating room and general endotracheal anesthesia was administered by the anesthesia staff.  Her abdomen was prepped and draped in a sterile fashion.  We did a timeout procedure.The Supraumbilical region was infiltrated with local. Supraumbilical incision was made. Subcutaneous tissues were dissected down revealing the anterior fascia. This was divided sharply along the midline. Peritoneal cavity was entered under direct vision without complication. A 0 Vicryl pursestring was placed around the fascial opening. Hassan trocar was inserted into the abdomen. The abdomen was insufflated with carbon dioxide in standard fashion. Under direct vision a 5 mm epigastric port was placed.  Chest she was noted to have some significant intra-abdominal adhesions of omentum to the anterior abdominal wall in the right upper quadrant.  This was between the [place] port and where the gallbladder was located.  I used cautery to gently take down these omental adhesions under direct vision and that freed up enough space to place 1 right-sided abdominal port.  This was 5 mm under direct vision.  I was then able to use a 5 mm camera in the epigastric port to finish taking down these adhesions.  Again this was only  omentum without any bowel involvement and I got good hemostasis.  Once this was accomplished we placed an additional right sided 5 mm port.  Local was used at each port site.  Laparoscopic exploration revealed an inflamed gallbladder.  The dome was retracted superior and medially.  The infundibulum was retracted inferior and laterally.  Dissection first identified the cystic artery.  This was clipped twice proximally, once distally and divided.  Further dissection revealed the cystic duct.  Critical view of safety was achieved.  3 clips were placed proximal on the cystic duct, 1 was placed distally and it was divided.  The gallbladder was taken off the liver bed using cautery achieving excellent hemostasis.  The gallbladder was placed in a bag and removed from the abdomen.  It was sent to pathology.  The liver bed was irrigated.  Liver bed was dry.  Clips were in good position.  The area of adhesiolysis was inspected and no complications were noted.  Ports were removed under direct vision.  Pneumoperitoneum was released.  Supraumbilical fascia was closed by tying the pursestring.  All 4 wounds were irrigated and the skin of each was closed with 4-0 Vicryl followed by Dermabond.  All counts were correct.  She tolerated the procedure well without apparent complication was taken recovery in stable condition.  PATIENT DISPOSITION:  PACU - hemodynamically stable.   Delay start of Pharmacological VTE agent (>24hrs) due to surgical blood loss or risk of bleeding:  no  Georganna Skeans, MD, MPH, FACS Pager: [phone]  8/3/20239:51 AM

## 2022-06-01 ENCOUNTER — Encounter (HOSPITAL_COMMUNITY): Payer: Self-pay | Admitting: General Surgery

## 2022-06-01 LAB — SURGICAL PATHOLOGY

## 2022-06-01 NOTE — Discharge Summary (Signed)
Patient ID: Tanya Patton [id number] [birth year] 66 y.o.  Admit date: 05/30/2022 Discharge date: 06/01/2022  Discharge Diagnosis acute cholecystitis, intra-abdominal adhesions RUQ s/p laparoscopic Cholecystectomy, LOA - 05/31/22  Consultants None  Reason for Admission: Patient is a 66 year old female with a history of abdominal pain periumbilical area radiotherapy quadrant.  States has been going on for the last 2 months.  She was recently ER approximately 2 days ago secondary to same type pain.  She states that usually the pain has been brought on with spicy meals.  Patient underwent work-up on a previous ER visit and was found to have gallstones/sludge in the gallbladder.  Patient with a normal WBC count.  Patient with normal LFTs.   Patient denies any other abdominal surgery.  Procedures Dr. Grandville Silos - Laparoscopic Cholecystectomy, LOA - 05/31/22  Hospital Course:  The patient was admitted and underwent a laparoscopic cholecystectomy.  The patient tolerated the procedure well.  On POD 0, the patient was tolerating a diet, voiding well, mobilizing, and pain was controlled with oral pain medications.  The patient was felt stable for DC home. We are arranging follow up in the office. She is to call her pcp to clarify home dm (her a1c is 5.9 here) and htn medications as she states what is on the med reconciliation is different than what she takes at home and I am unable to see a recent note from her pcp. Patient had filled 20 tabs oxycodone '5mg'$  from the ED on 8/1 and reports she still had 17 tabs left at home; no further pain prescription was sent at this time. Discussed discharge instructions, restrictions and return/call back precautions.   Physical Exam: Gen:  Alert, NAD, pleasant Card:  Reg Pulm: CTA b/l, normal rate and effort Abd: Soft, ND, appropriately tender around incisions, Incisions with glue intact appears well and are without drainage, bleeding, or signs of  infection Psych: A&Ox3  Skin: no rashes noted, warm and dry  Allergies as of 05/31/2022       Reactions   Atorvastatin Other (See Comments)   muscle aches   Lisinopril Cough   Losartan Potassium-hctz Other (See Comments)   Unknown per patient    Rosuvastatin Other (See Comments)   Muscle aches   Shellfish Allergy Hives, Itching        Medication List     STOP taking these medications    cyclobenzaprine 10 MG tablet Commonly known as: FLEXERIL   dicyclomine 20 MG tablet Commonly known as: BENTYL   naproxen 250 MG tablet Commonly known as: NAPROSYN   ondansetron 4 MG disintegrating tablet Commonly known as: ZOFRAN-ODT   oxyCODONE-acetaminophen 5-325 MG tablet Commonly known as: PERCOCET/ROXICET       TAKE these medications    azelastine 0.05 % ophthalmic solution Commonly known as: OPTIVAR Place 1 drop into both eyes daily as needed (allergies).   ezetimibe 10 MG tablet Commonly known as: ZETIA Take 10 mg by mouth daily.   fluticasone 50 MCG/ACT nasal spray Commonly known as: FLONASE Place 2 sprays into both nostrils daily as needed for allergies.   hydrOXYzine 10 MG tablet Commonly known as: ATARAX Take 10 mg by mouth daily as needed for itching.   levocetirizine 5 MG tablet Commonly known as: XYZAL Take 5 mg by mouth daily.   lisinopril-hydrochlorothiazide 10-12.5 MG tablet Commonly known as: ZESTORETIC Take 1 tablet by mouth daily.   losartan 25 MG tablet Commonly known as: COZAAR Take 25 mg by mouth daily.  meloxicam 7.5 MG tablet Commonly known as: MOBIC Take 7.5 mg by mouth daily as needed for pain.   metFORMIN 500 MG tablet Commonly known as: GLUCOPHAGE Take by mouth 2 (two) times daily with a meal.   MILK OF MAGNESIA PO Take 1 Capful by mouth every other day.   montelukast 10 MG tablet Commonly known as: SINGULAIR Take 10 mg by mouth daily.   oxyCODONE 5 MG immediate release tablet Commonly known as: Roxicodone Take 1  tablet (5 mg total) by mouth every 4 (four) hours as needed for severe pain.   polyethylene glycol 17 g packet Commonly known as: MIRALAX / GLYCOLAX Take 17 g by mouth daily.   rosuvastatin 20 MG tablet Commonly known as: CRESTOR Take 20 mg by mouth daily.          Follow-up Information     [facility] Follow up on 06/21/2022.   Specialty: General Surgery Why: 06/21/22 at 3 pm. Please bring a copy of your photo ID and insurance card. Arrive 30 minutes prior to your appointment for paperwork. Contact information: [address] [place] [address] [phone]         Janie Morning, DO. Call in 1 day(s).   Specialty: Family Medicine Contact information: [address] [place]  [address] [phone]                 Signed: Alferd Apa, [facility] 06/01/2022, 10:54 AM Please see Amion for pager number during day hours 7:00am-4:30pm

## 2022-08-07 ENCOUNTER — Other Ambulatory Visit: Payer: Self-pay | Admitting: Family Medicine

## 2022-08-07 DIAGNOSIS — Z1231 Encounter for screening mammogram for malignant neoplasm of breast: Secondary | ICD-10-CM

## 2022-09-11 ENCOUNTER — Ambulatory Visit
Admission: RE | Admit: 2022-09-11 | Discharge: 2022-09-11 | Disposition: A | Discharge status: Home / Self Care | Payer: Medicare Other | Source: Ambulatory Visit | Attending: Family Medicine | Admitting: Family Medicine

## 2022-09-11 DIAGNOSIS — Z1231 Encounter for screening mammogram for malignant neoplasm of breast: Secondary | ICD-10-CM

## 2022-09-17 DIAGNOSIS — I700 Atherosclerosis of aorta: Secondary | ICD-10-CM | POA: Diagnosis not present

## 2022-09-17 DIAGNOSIS — Z87898 Personal history of other specified conditions: Secondary | ICD-10-CM | POA: Diagnosis not present

## 2022-09-17 DIAGNOSIS — R7309 Other abnormal glucose: Secondary | ICD-10-CM | POA: Diagnosis not present

## 2022-09-17 DIAGNOSIS — Z8679 Personal history of other diseases of the circulatory system: Secondary | ICD-10-CM | POA: Diagnosis not present

## 2022-09-28 DIAGNOSIS — Z13820 Encounter for screening for osteoporosis: Secondary | ICD-10-CM | POA: Diagnosis not present

## 2022-09-28 DIAGNOSIS — Z23 Encounter for immunization: Secondary | ICD-10-CM | POA: Diagnosis not present

## 2022-09-28 DIAGNOSIS — E7800 Pure hypercholesterolemia, unspecified: Secondary | ICD-10-CM | POA: Diagnosis not present

## 2022-09-28 DIAGNOSIS — Z0000 Encounter for general adult medical examination without abnormal findings: Secondary | ICD-10-CM | POA: Diagnosis not present

## 2022-09-28 DIAGNOSIS — R7309 Other abnormal glucose: Secondary | ICD-10-CM | POA: Diagnosis not present

## 2022-09-28 DIAGNOSIS — J3089 Other allergic rhinitis: Secondary | ICD-10-CM | POA: Diagnosis not present

## 2022-09-28 DIAGNOSIS — M5136 Other intervertebral disc degeneration, lumbar region: Secondary | ICD-10-CM | POA: Diagnosis not present

## 2022-09-28 DIAGNOSIS — I700 Atherosclerosis of aorta: Secondary | ICD-10-CM | POA: Diagnosis not present

## 2022-12-14 ENCOUNTER — Encounter (HOSPITAL_BASED_OUTPATIENT_CLINIC_OR_DEPARTMENT_OTHER): Payer: Self-pay

## 2022-12-14 ENCOUNTER — Emergency Department (HOSPITAL_BASED_OUTPATIENT_CLINIC_OR_DEPARTMENT_OTHER)
Admission: EM | Admit: 2022-12-14 | Discharge: 2022-12-14 | Disposition: A | Discharge status: Home / Self Care | Payer: Medicare Other | Attending: Emergency Medicine | Admitting: Emergency Medicine

## 2022-12-14 ENCOUNTER — Emergency Department (HOSPITAL_BASED_OUTPATIENT_CLINIC_OR_DEPARTMENT_OTHER): Payer: Medicare Other

## 2022-12-14 DIAGNOSIS — Z79899 Other long term (current) drug therapy: Secondary | ICD-10-CM | POA: Insufficient documentation

## 2022-12-14 DIAGNOSIS — I10 Essential (primary) hypertension: Secondary | ICD-10-CM

## 2022-12-14 DIAGNOSIS — R519 Headache, unspecified: Secondary | ICD-10-CM | POA: Diagnosis not present

## 2022-12-14 LAB — CBC
HCT: 45 % (ref 36.0–46.0)
Hemoglobin: 15.3 g/dL — ABNORMAL HIGH (ref 12.0–15.0)
MCH: 31.4 pg (ref 26.0–34.0)
MCHC: 34 g/dL (ref 30.0–36.0)
MCV: 92.4 fL (ref 80.0–100.0)
Platelets: 242 10*3/uL (ref 150–400)
RBC: 4.87 MIL/uL (ref 3.87–5.11)
RDW: 13.3 % (ref 11.5–15.5)
WBC: 5.6 10*3/uL (ref 4.0–10.5)
nRBC: 0 % (ref 0.0–0.2)

## 2022-12-14 LAB — BASIC METABOLIC PANEL
Anion gap: 10 (ref 5–15)
BUN: 12 mg/dL (ref 8–23)
CO2: 26 mmol/L (ref 22–32)
Calcium: 10.7 mg/dL — ABNORMAL HIGH (ref 8.9–10.3)
Chloride: 104 mmol/L (ref 98–111)
Creatinine, Ser: 0.7 mg/dL (ref 0.44–1.00)
GFR, Estimated: >60 mL/min (ref >60)
Glucose, Bld: 95 mg/dL (ref 70–99)
Potassium: 4 mmol/L (ref 3.5–5.1)
Sodium: 140 mmol/L (ref 135–145)

## 2022-12-14 MED ORDER — LABETALOL HCL 5 MG/ML IV SOLN
10.0000 mg | Freq: Once | INTRAVENOUS | Status: AC
  Administered 2022-12-14: 10 mg via INTRAVENOUS
  Filled 2022-12-14: qty 4

## 2022-12-14 MED ORDER — AMLODIPINE BESYLATE 5 MG PO TABS: 5.0000 mg | ORAL_TABLET | Freq: Every day | ORAL | 0 refills | Status: DC

## 2022-12-14 MED ORDER — AMLODIPINE BESYLATE 5 MG PO TABS
5.0000 mg | ORAL_TABLET | Freq: Once | ORAL | Status: AC
  Administered 2022-12-14: 5 mg via ORAL
  Filled 2022-12-14: qty 1

## 2022-12-14 NOTE — Discharge Instructions (Signed)
Start taking the Norvasc for your blood pressure.  Follow-up with your primary care doctor to be rechecked.

## 2022-12-14 NOTE — ED Triage Notes (Signed)
States BP 195/120 at home.  States have been having lots of stress.  Having headache for last week.  States was on BP meds but taken off about a year ago

## 2022-12-14 NOTE — ED Provider Notes (Signed)
[facility] Provider Note   CSN: [id number] Arrival date & time: 12/14/22  1847     History  Chief Complaint  Patient presents with   Hypertension    Tanya Patton is a 67 y.o. female.   Hypertension     She presents the ED with complaints of a headache.  Patient states she has history of hypertension but was taken off medication about a year ago because her blood pressure was well-controlled.  Patient states she has not checked her blood pressure in a while but recently she has been under a lot of stress.  She started having a headache for the last week and when she took her blood pressure today was very elevated in the 200s.  She is not having any chest pain.  No abdominal pain.  No vomiting or diarrhea.  Home Medications Prior to Admission medications   Medication Sig Start Date End Date Taking? Authorizing Provider  amLODipine (NORVASC) 5 MG tablet Take 1 tablet (5 mg total) by mouth daily. 12/14/22  Yes Dorie Rank, MD  azelastine (OPTIVAR) 0.05 % ophthalmic solution Place 1 drop into both eyes daily as needed (allergies). 03/02/22   [provider]  ezetimibe (ZETIA) 10 MG tablet Take 10 mg by mouth daily. 04/16/22   [provider]  fluticasone (FLONASE) 50 MCG/ACT nasal spray Place 2 sprays into both nostrils daily as needed for allergies.    [provider]  hydrOXYzine (ATARAX) 10 MG tablet Take 10 mg by mouth daily as needed for itching. 03/02/22   [provider]  levocetirizine (XYZAL) 5 MG tablet Take 5 mg by mouth daily.    [provider]  lisinopril-hydrochlorothiazide (PRINZIDE,ZESTORETIC) 10-12.5 MG per tablet Take 1 tablet by mouth daily. Patient not taking: Reported on 05/31/2022    [provider]  losartan (COZAAR) 25 MG tablet Take 25 mg by mouth daily. Patient not taking: Reported on 05/31/2022    [provider]  Magnesium Hydroxide (MILK OF MAGNESIA PO)  Take 1 Capful by mouth every other day.    [provider]  meloxicam (MOBIC) 7.5 MG tablet Take 7.5 mg by mouth daily as needed for pain. 04/15/22   [provider]  metFORMIN (GLUCOPHAGE) 500 MG tablet Take by mouth 2 (two) times daily with a meal. Patient not taking: Reported on 05/31/2022    [provider]  montelukast (SINGULAIR) 10 MG tablet Take 10 mg by mouth daily.    [provider]  oxyCODONE (ROXICODONE) 5 MG immediate release tablet Take 1 tablet (5 mg total) by mouth every 4 (four) hours as needed for severe pain. 05/28/22   Gareth Morgan, MD  polyethylene glycol (MIRALAX / GLYCOLAX) 17 g packet Take 17 g by mouth daily.    [provider]  rosuvastatin (CRESTOR) 20 MG tablet Take 20 mg by mouth daily. Patient not taking: Reported on 05/31/2022    [provider]      Allergies    Atorvastatin, Lisinopril, Losartan potassium-hctz, Rosuvastatin, and Shellfish allergy    Review of Systems   Review of Systems  Physical Exam Updated Vital Signs BP (!) 188/84   Pulse 66   Temp 97.9 F (36.6 C)   Resp 18   Ht 1.524 m (5')   Wt 78 kg   SpO2 98%   BMI 33.59 kg/m  Physical Exam Vitals and nursing note reviewed.  Constitutional:      General: She is not in  acute distress.    Appearance: She is well-developed.  HENT:     Head: Normocephalic and atraumatic.     Right Ear: External ear normal.     Left Ear: External ear normal.  Eyes:     General: No scleral icterus.       Right eye: No discharge.        Left eye: No discharge.     Conjunctiva/sclera: Conjunctivae normal.  Neck:     Trachea: No tracheal deviation.  Cardiovascular:     Rate and Rhythm: Normal rate and regular rhythm.  Pulmonary:     Effort: Pulmonary effort is normal. No respiratory distress.     Breath sounds: Normal breath sounds. No stridor. No wheezing or rales.  Abdominal:     General: Bowel sounds are normal. There is no distension.      Palpations: Abdomen is soft.     Tenderness: There is no abdominal tenderness. There is no guarding or rebound.  Musculoskeletal:        General: No tenderness or deformity.     Cervical back: Neck supple.  Skin:    General: Skin is warm and dry.     Findings: No rash.  Neurological:     General: No focal deficit present.     Mental Status: She is alert.     Cranial Nerves: No cranial nerve deficit, dysarthria or facial asymmetry.     Sensory: No sensory deficit.     Motor: No abnormal muscle tone or seizure activity.     Coordination: Coordination normal.  Psychiatric:        Mood and Affect: Mood normal.     ED Results / Procedures / Treatments   Labs (all labs ordered are listed, but only abnormal results are displayed) Labs Reviewed  CBC - Abnormal; Notable for the following components:      Result Value   Hemoglobin 15.3 (*)    All other components within normal limits  BASIC METABOLIC PANEL - Abnormal; Notable for the following components:   Calcium 10.7 (*)    All other components within normal limits    EKG EKG Interpretation  Date/Time:  Friday December 14 2022 19:05:00 EST Ventricular Rate:  99 PR Interval:  156 QRS Duration: 80 QT Interval:  358 QTC Calculation: 459 R Axis:   -25 Text Interpretation: Normal sinus rhythm Septal infarct (cited on or before 08-Apr-2008) Abnormal ECG When compared with ECG of 18-Sep-2013 10:00, PR interval has decreased Questionable change in initial forces of Anteroseptal leads Confirmed by Dorie Rank [phone]) on 12/14/2022 10:05:28 PM  Radiology CT Head Wo Contrast  Result Date: 12/14/2022 CLINICAL DATA:  Headache. EXAM: CT HEAD WITHOUT CONTRAST TECHNIQUE: Contiguous axial images were obtained from the base of the skull through the vertex without intravenous contrast. RADIATION DOSE REDUCTION: This exam was performed according to the departmental dose-optimization program which includes automated exposure control, adjustment of  the mA and/or kV according to patient size and/or use of iterative reconstruction technique. COMPARISON:  Head CT dated 09/25/2020. FINDINGS: Brain: The ventricles and sulci are appropriate size for the patient's age. The gray-white matter discrimination is preserved. There is no acute intracranial hemorrhage. No mass effect or midline shift. No extra-axial fluid collection. Vascular: No hyperdense vessel or unexpected calcification. Skull: Normal. Negative for fracture or focal lesion. Sinuses/Orbits: No acute finding. Other: None IMPRESSION: No acute intracranial pathology. Electronically Signed   By: Anner Crete M.D.   On: 12/14/2022 20:34  Procedures Procedures    Medications Ordered in ED Medications  amLODipine (NORVASC) tablet 5 mg (has no administration in time range)  labetalol (NORMODYNE) injection 10 mg (10 mg Intravenous Given 12/14/22 2045)    ED Course/ Medical Decision Making/ A&P Clinical Course as of 12/14/22 2242  Fri Dec 14, 2022  2205 Blood pressure has improved from the [id number] systolic.  CBC and metabolic panel normal. [JK]  2205 CT without acute findings. [JK]    Clinical Course User Index [JK] Dorie Rank, MD                             Medical Decision Making Problems Addressed: Hypertension, unspecified type: acute illness or injury that poses a threat to life or bodily functions  Amount and/or Complexity of Data Reviewed Labs: ordered. Decision-making details documented in ED Course. Radiology: ordered and independent interpretation performed.  Risk Prescription drug management.   Patient presented to the ED for evaluation of hypertension.  Patient previously was on blood pressure medication but has not been on any for a while.  No signs of acute endorgan damage.  Patient not having chest pain.  She was complaining of a headache so CT scan was performed and fortunately no signs of cerebral hemorrhage or other acute abnormality.  Patient was given IV  blood pressure medications with improvement of her blood pressure.  Will hold off on more aggressive hypertension management at this time as it is unclear how long she has been hypertensive.  Discussed starting oral blood pressure medications.  Close outpatient follow-up with PCP.        Final Clinical Impression(s) / ED Diagnoses Final diagnoses:  Hypertension, unspecified type    Rx / DC Orders ED Discharge Orders          Ordered    amLODipine (NORVASC) 5 MG tablet  Daily        12/14/22 2240              Dorie Rank, MD 12/14/22 2242

## 2022-12-18 ENCOUNTER — Telehealth: Payer: Self-pay

## 2022-12-18 NOTE — Telephone Encounter (Signed)
     Patient  visit on 2/16  at [place]   Have you been able to follow up with your primary care physician? Yes following up by the end of the week  The patient was or was not able to obtain any needed medicine or equipment. Yes   Are there diet recommendations that you are having difficulty following? Na   Patient expresses understanding of discharge instructions and education provided has no other needs at this time.  Yes      [place] [phone] [address], [place], [place] [address] Phone: [phone] Email: [email or web address]

## 2023-04-01 DIAGNOSIS — Z780 Asymptomatic menopausal state: Secondary | ICD-10-CM | POA: Diagnosis not present

## 2023-04-01 DIAGNOSIS — E7800 Pure hypercholesterolemia, unspecified: Secondary | ICD-10-CM | POA: Diagnosis not present

## 2023-04-01 DIAGNOSIS — I700 Atherosclerosis of aorta: Secondary | ICD-10-CM | POA: Diagnosis not present

## 2023-04-01 DIAGNOSIS — R7309 Other abnormal glucose: Secondary | ICD-10-CM | POA: Diagnosis not present

## 2023-04-08 DIAGNOSIS — I700 Atherosclerosis of aorta: Secondary | ICD-10-CM | POA: Diagnosis not present

## 2023-04-08 DIAGNOSIS — Z79899 Other long term (current) drug therapy: Secondary | ICD-10-CM | POA: Diagnosis not present

## 2023-04-08 DIAGNOSIS — R7309 Other abnormal glucose: Secondary | ICD-10-CM | POA: Diagnosis not present

## 2023-04-08 DIAGNOSIS — I10 Essential (primary) hypertension: Secondary | ICD-10-CM | POA: Diagnosis not present

## 2023-04-08 DIAGNOSIS — M5136 Other intervertebral disc degeneration, lumbar region: Secondary | ICD-10-CM | POA: Diagnosis not present

## 2023-04-08 DIAGNOSIS — E7800 Pure hypercholesterolemia, unspecified: Secondary | ICD-10-CM | POA: Diagnosis not present

## 2023-04-08 DIAGNOSIS — J3089 Other allergic rhinitis: Secondary | ICD-10-CM | POA: Diagnosis not present

## 2023-05-07 DIAGNOSIS — I10 Essential (primary) hypertension: Secondary | ICD-10-CM | POA: Diagnosis not present

## 2023-07-18 DIAGNOSIS — E7800 Pure hypercholesterolemia, unspecified: Secondary | ICD-10-CM | POA: Diagnosis not present

## 2023-08-29 ENCOUNTER — Other Ambulatory Visit: Payer: Self-pay | Admitting: Family Medicine

## 2023-08-29 DIAGNOSIS — Z1231 Encounter for screening mammogram for malignant neoplasm of breast: Secondary | ICD-10-CM

## 2023-09-19 ENCOUNTER — Ambulatory Visit
Admission: RE | Admit: 2023-09-19 | Discharge: 2023-09-19 | Disposition: A | Discharge status: Home / Self Care | Payer: Medicare Other | Source: Ambulatory Visit | Attending: Family Medicine | Admitting: Family Medicine

## 2023-09-19 DIAGNOSIS — Z1231 Encounter for screening mammogram for malignant neoplasm of breast: Secondary | ICD-10-CM

## 2023-09-24 DIAGNOSIS — R7309 Other abnormal glucose: Secondary | ICD-10-CM | POA: Diagnosis not present

## 2023-09-24 DIAGNOSIS — Z79899 Other long term (current) drug therapy: Secondary | ICD-10-CM | POA: Diagnosis not present

## 2023-09-24 DIAGNOSIS — I10 Essential (primary) hypertension: Secondary | ICD-10-CM | POA: Diagnosis not present

## 2023-09-24 DIAGNOSIS — E7800 Pure hypercholesterolemia, unspecified: Secondary | ICD-10-CM | POA: Diagnosis not present

## 2023-10-04 DIAGNOSIS — J3089 Other allergic rhinitis: Secondary | ICD-10-CM | POA: Diagnosis not present

## 2023-10-04 DIAGNOSIS — Z23 Encounter for immunization: Secondary | ICD-10-CM | POA: Diagnosis not present

## 2023-10-04 DIAGNOSIS — E559 Vitamin D deficiency, unspecified: Secondary | ICD-10-CM | POA: Diagnosis not present

## 2023-10-04 DIAGNOSIS — Z0000 Encounter for general adult medical examination without abnormal findings: Secondary | ICD-10-CM | POA: Diagnosis not present

## 2023-10-04 DIAGNOSIS — E7800 Pure hypercholesterolemia, unspecified: Secondary | ICD-10-CM | POA: Diagnosis not present

## 2023-10-04 DIAGNOSIS — M51369 Other intervertebral disc degeneration, lumbar region without mention of lumbar back pain or lower extremity pain: Secondary | ICD-10-CM | POA: Diagnosis not present

## 2023-12-14 DIAGNOSIS — H43393 Other vitreous opacities, bilateral: Secondary | ICD-10-CM | POA: Diagnosis not present

## 2023-12-14 DIAGNOSIS — H2513 Age-related nuclear cataract, bilateral: Secondary | ICD-10-CM | POA: Diagnosis not present

## 2024-01-15 DIAGNOSIS — Z860101 Personal history of adenomatous and serrated colon polyps: Secondary | ICD-10-CM | POA: Diagnosis not present

## 2024-01-15 DIAGNOSIS — Z09 Encounter for follow-up examination after completed treatment for conditions other than malignant neoplasm: Secondary | ICD-10-CM | POA: Diagnosis not present

## 2024-01-15 DIAGNOSIS — D124 Benign neoplasm of descending colon: Secondary | ICD-10-CM | POA: Diagnosis not present

## 2024-01-15 DIAGNOSIS — D128 Benign neoplasm of rectum: Secondary | ICD-10-CM | POA: Diagnosis not present

## 2024-01-17 DIAGNOSIS — D128 Benign neoplasm of rectum: Secondary | ICD-10-CM | POA: Diagnosis not present

## 2024-01-17 DIAGNOSIS — D124 Benign neoplasm of descending colon: Secondary | ICD-10-CM | POA: Diagnosis not present

## 2024-02-04 DIAGNOSIS — L989 Disorder of the skin and subcutaneous tissue, unspecified: Secondary | ICD-10-CM | POA: Diagnosis not present

## 2024-03-31 DIAGNOSIS — R7309 Other abnormal glucose: Secondary | ICD-10-CM | POA: Diagnosis not present

## 2024-03-31 DIAGNOSIS — I10 Essential (primary) hypertension: Secondary | ICD-10-CM | POA: Diagnosis not present

## 2024-03-31 DIAGNOSIS — E559 Vitamin D deficiency, unspecified: Secondary | ICD-10-CM | POA: Diagnosis not present

## 2024-03-31 DIAGNOSIS — I700 Atherosclerosis of aorta: Secondary | ICD-10-CM | POA: Diagnosis not present

## 2024-04-07 DIAGNOSIS — J3089 Other allergic rhinitis: Secondary | ICD-10-CM | POA: Diagnosis not present

## 2024-04-07 DIAGNOSIS — I10 Essential (primary) hypertension: Secondary | ICD-10-CM | POA: Diagnosis not present

## 2024-04-07 DIAGNOSIS — E7800 Pure hypercholesterolemia, unspecified: Secondary | ICD-10-CM | POA: Diagnosis not present

## 2024-04-07 DIAGNOSIS — Z79899 Other long term (current) drug therapy: Secondary | ICD-10-CM | POA: Diagnosis not present

## 2024-04-07 DIAGNOSIS — G4730 Sleep apnea, unspecified: Secondary | ICD-10-CM | POA: Diagnosis not present

## 2024-04-07 DIAGNOSIS — M5136 Other intervertebral disc degeneration, lumbar region with discogenic back pain only: Secondary | ICD-10-CM | POA: Diagnosis not present

## 2024-04-07 DIAGNOSIS — R946 Abnormal results of thyroid function studies: Secondary | ICD-10-CM | POA: Diagnosis not present

## 2024-04-07 DIAGNOSIS — E559 Vitamin D deficiency, unspecified: Secondary | ICD-10-CM | POA: Diagnosis not present

## 2024-04-07 DIAGNOSIS — R7309 Other abnormal glucose: Secondary | ICD-10-CM | POA: Diagnosis not present

## 2024-09-02 ENCOUNTER — Other Ambulatory Visit: Payer: Self-pay | Admitting: Family Medicine

## 2024-09-02 DIAGNOSIS — Z1231 Encounter for screening mammogram for malignant neoplasm of breast: Secondary | ICD-10-CM

## 2024-09-23 ENCOUNTER — Emergency Department (HOSPITAL_BASED_OUTPATIENT_CLINIC_OR_DEPARTMENT_OTHER)
Admission: EM | Admit: 2024-09-23 | Discharge: 2024-09-23 | Disposition: A | Discharge status: Home / Self Care | Attending: Emergency Medicine | Admitting: Emergency Medicine

## 2024-09-23 ENCOUNTER — Other Ambulatory Visit: Payer: Self-pay

## 2024-09-23 ENCOUNTER — Encounter (HOSPITAL_BASED_OUTPATIENT_CLINIC_OR_DEPARTMENT_OTHER): Payer: Self-pay

## 2024-09-23 ENCOUNTER — Ambulatory Visit
Admission: RE | Admit: 2024-09-23 | Discharge: 2024-09-23 | Disposition: A | Discharge status: Home / Self Care | Source: Ambulatory Visit | Attending: Family Medicine | Admitting: Family Medicine

## 2024-09-23 DIAGNOSIS — H66002 Acute suppurative otitis media without spontaneous rupture of ear drum, left ear: Secondary | ICD-10-CM | POA: Diagnosis not present

## 2024-09-23 DIAGNOSIS — J069 Acute upper respiratory infection, unspecified: Secondary | ICD-10-CM | POA: Diagnosis not present

## 2024-09-23 DIAGNOSIS — R059 Cough, unspecified: Secondary | ICD-10-CM | POA: Diagnosis present

## 2024-09-23 DIAGNOSIS — Z1231 Encounter for screening mammogram for malignant neoplasm of breast: Secondary | ICD-10-CM

## 2024-09-23 MED ORDER — AMOXICILLIN 500 MG PO CAPS
500.0000 mg | ORAL_CAPSULE | Freq: Once | ORAL | Status: AC
  Administered 2024-09-23: 500 mg via ORAL
  Filled 2024-09-23: qty 1

## 2024-09-23 MED ORDER — AMOXICILLIN 500 MG PO CAPS: 500.0000 mg | ORAL_CAPSULE | Freq: Three times a day (TID) | ORAL | 0 refills | Status: AC

## 2024-09-23 NOTE — ED Triage Notes (Signed)
 Complaining of a sore throat and ear aches since Friday. Does have some congestion as well

## 2024-09-23 NOTE — ED Provider Notes (Signed)
 [facility] AT [facility] Provider Note   CSN: [id number] Arrival date & time: 09/23/24  0000     Patient presents with: Sore Throat   Tanya Patton is a 68 y.o. female.   The history is provided by the patient.   Patient reports for the past 6 days she has had cough, congestion, sore throat and now having bilateral ear pain.  No fevers or vomiting.  No myalgias, no diaphoresis.  No chest pain or shortness of breath. She is requesting relief due to the impending holiday She has no other acute complaints    Prior to Admission medications   Medication Sig Start Date End Date Taking? Authorizing Provider  amoxicillin  (AMOXIL ) 500 MG capsule Take 1 capsule (500 mg total) by mouth 3 (three) times daily. 09/23/24  Yes Midge Golas, MD  azelastine (OPTIVAR) 0.05 % ophthalmic solution Place 1 drop into both eyes daily as needed (allergies). 03/02/22   [provider]  ezetimibe (ZETIA) 10 MG tablet Take 10 mg by mouth daily. 04/16/22   [provider]  fluticasone (FLONASE) 50 MCG/ACT nasal spray Place 2 sprays into both nostrils daily as needed for allergies.    [provider]  hydrOXYzine (ATARAX) 10 MG tablet Take 10 mg by mouth daily as needed for itching. 03/02/22   [provider]  levocetirizine (XYZAL) 5 MG tablet Take 5 mg by mouth daily.    [provider]  Magnesium Hydroxide (MILK OF MAGNESIA PO) Take 1 Capful by mouth every other day.    [provider]  meloxicam (MOBIC) 7.5 MG tablet Take 7.5 mg by mouth daily as needed for pain. 04/15/22   [provider]  montelukast (SINGULAIR) 10 MG tablet Take 10 mg by mouth daily.    [provider]  polyethylene glycol (MIRALAX  / GLYCOLAX ) 17 g packet Take 17 g by mouth daily.    [provider]  rosuvastatin (CRESTOR) 20 MG tablet Take 20 mg by mouth daily. Patient not taking: Reported on 05/31/2022    [provider]    Allergies: Atorvastatin, Lisinopril , Losartan  potassium-hctz, Rosuvastatin, and Shellfish allergy    Review of Systems  Constitutional:  Negative for diaphoresis and fever.  HENT:  Positive for congestion, ear pain and sore throat.   Respiratory:  Positive for cough. Negative for shortness of breath.        Denies hemoptysis  Cardiovascular:  Negative for chest pain.  Gastrointestinal:  Negative for vomiting.  Musculoskeletal:  Negative for myalgias.    Updated Vital Signs BP 139/85 (BP Location: Right Arm)   Pulse 93   Temp 98.7 F (37.1 C) (Oral)   Resp 16   Ht 1.524 m (5')   Wt 83.9 kg   SpO2 96%   BMI 36.13 kg/m   Physical Exam CONSTITUTIONAL: Well developed/well nourished, no distress HEAD: Normocephalic/atraumatic EYES: EOMI/PERRL ENMT: Mucous membranes moist, uvula midline, no erythema or exudates, no stridor or drooling Left TM is dull/darkened and bulging NECK: supple no meningeal signs CV: S1/S2 noted, no murmurs/rubs/gallops noted LUNGS: Lungs are clear to auscultation bilaterally, no apparent distress ABDOMEN: soft NEURO: Pt is awake/alert/appropriate, moves all extremitiesx4.  No facial droop.    (all labs ordered are listed, but only abnormal results are displayed) Labs Reviewed - No data to display  EKG: None  Radiology: No results found.   Procedures   Medications Ordered in the ED  amoxicillin  (AMOXIL ) capsule 500 mg (500 mg Oral Given 09/23/24  9970)                                    Medical Decision Making Risk Prescription drug management.   Patient presents with symptoms for up to 6 days.  She reports cough, congestion, sore throat now having ear pain.  Patient likely had viral upper respiratory infection that has now triggered an acute bacterial otitis media We will place patient on oral antibiotics. Differential would include pneumonia, COVID-19, influenza However given her overall well appearance, she is not tachycardic,  not tachypneic and no hypoxia without added lung sounds, will defer further workup at this time       Final diagnoses:  Non-recurrent acute suppurative otitis media of left ear without spontaneous rupture of tympanic membrane  Viral URI with cough    ED Discharge Orders          Ordered    amoxicillin  (AMOXIL ) 500 MG capsule  3 times daily        09/23/24 0019               Midge Golas, MD 09/23/24 0036

## 2024-10-06 ENCOUNTER — Other Ambulatory Visit: Payer: Self-pay | Admitting: Family Medicine

## 2024-10-06 DIAGNOSIS — Z87891 Personal history of nicotine dependence: Secondary | ICD-10-CM
